# Patient Record
Sex: Male | Born: 1985 | Race: White | Hispanic: No | Marital: Married | Smoking: Never smoker
Health system: Southern US, Community
[De-identification: ages and names within clinical notes are randomized; demographics above are authoritative.]

## PROBLEM LIST (undated history)

## (undated) DIAGNOSIS — J45909 Unspecified asthma, uncomplicated: Secondary | ICD-10-CM

## (undated) HISTORY — DX: Unspecified asthma, uncomplicated: J45.909

## (undated) HISTORY — PX: NO PAST SURGERIES: SHX2092

---

## 2015-11-25 ENCOUNTER — Emergency Department
Admission: EM | Admit: 2015-11-25 | Discharge: 2015-11-25 | Disposition: A | Discharge status: Home / Self Care | Payer: No Typology Code available for payment source | Attending: Student | Admitting: Student

## 2015-11-25 ENCOUNTER — Encounter: Payer: Self-pay | Admitting: Emergency Medicine

## 2015-11-25 ENCOUNTER — Emergency Department: Payer: No Typology Code available for payment source

## 2015-11-25 DIAGNOSIS — Z23 Encounter for immunization: Secondary | ICD-10-CM | POA: Diagnosis not present

## 2015-11-25 DIAGNOSIS — S60852A Superficial foreign body of left wrist, initial encounter: Secondary | ICD-10-CM | POA: Diagnosis not present

## 2015-11-25 DIAGNOSIS — Y999 Unspecified external cause status: Secondary | ICD-10-CM | POA: Diagnosis not present

## 2015-11-25 DIAGNOSIS — W3400XA Accidental discharge from unspecified firearms or gun, initial encounter: Secondary | ICD-10-CM | POA: Diagnosis not present

## 2015-11-25 DIAGNOSIS — F17220 Nicotine dependence, chewing tobacco, uncomplicated: Secondary | ICD-10-CM | POA: Diagnosis not present

## 2015-11-25 DIAGNOSIS — Y939 Activity, unspecified: Secondary | ICD-10-CM | POA: Diagnosis not present

## 2015-11-25 DIAGNOSIS — S61502A Unspecified open wound of left wrist, initial encounter: Secondary | ICD-10-CM | POA: Diagnosis not present

## 2015-11-25 DIAGNOSIS — Y929 Unspecified place or not applicable: Secondary | ICD-10-CM | POA: Diagnosis not present

## 2015-11-25 MED ORDER — TETANUS-DIPHTH-ACELL PERTUSSIS 5-2.5-18.5 LF-MCG/0.5 IM SUSP
0.5000 mL | Freq: Once | INTRAMUSCULAR | Status: AC
  Administered 2015-11-25: 0.5 mL via INTRAMUSCULAR
  Filled 2015-11-25: qty 0.5
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
## 2015-11-25 NOTE — ED Notes (Signed)

## 2015-11-30 ENCOUNTER — Encounter: Payer: Self-pay | Admitting: General Surgery

## 2015-11-30 ENCOUNTER — Ambulatory Visit (INDEPENDENT_AMBULATORY_CARE_PROVIDER_SITE_OTHER): Payer: No Typology Code available for payment source | Admitting: General Surgery

## 2015-11-30 VITALS — BP 144/83 | HR 73 | Temp 98.7°F | Ht 74.0 in | Wt 213.0 lb

## 2015-11-30 DIAGNOSIS — M795 Residual foreign body in soft tissue: Secondary | ICD-10-CM | POA: Diagnosis not present
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
## 2015-11-30 NOTE — Patient Instructions (Addendum)
Risk of removal. It will only be removed if there is an infection. Please give us a call if you have any questions or concerns.

## 2016-09-24 ENCOUNTER — Emergency Department
Admission: EM | Admit: 2016-09-24 | Discharge: 2016-09-24 | Disposition: A | Discharge status: Home / Self Care | Payer: No Typology Code available for payment source | Attending: Emergency Medicine | Admitting: Emergency Medicine

## 2016-09-24 ENCOUNTER — Encounter: Payer: Self-pay | Admitting: Emergency Medicine

## 2016-09-24 ENCOUNTER — Emergency Department: Payer: No Typology Code available for payment source

## 2016-09-24 DIAGNOSIS — J45909 Unspecified asthma, uncomplicated: Secondary | ICD-10-CM | POA: Insufficient documentation

## 2016-09-24 DIAGNOSIS — F17210 Nicotine dependence, cigarettes, uncomplicated: Secondary | ICD-10-CM | POA: Diagnosis not present

## 2016-09-24 DIAGNOSIS — Z79899 Other long term (current) drug therapy: Secondary | ICD-10-CM | POA: Diagnosis not present

## 2016-09-24 DIAGNOSIS — F419 Anxiety disorder, unspecified: Secondary | ICD-10-CM | POA: Diagnosis not present

## 2016-09-24 DIAGNOSIS — R079 Chest pain, unspecified: Secondary | ICD-10-CM | POA: Diagnosis not present

## 2016-09-24 LAB — FIBRIN DERIVATIVES D-DIMER (ARMC ONLY): Fibrin derivatives D-dimer (ARMC): 97.17 (ref 0.00–499.00)

## 2016-09-24 LAB — BASIC METABOLIC PANEL
Anion gap: 8 (ref 5–15)
BUN: 14 mg/dL (ref 6–20)
CHLORIDE: 104 mmol/L (ref 101–111)
CO2: 28 mmol/L (ref 22–32)
Calcium: 9.4 mg/dL (ref 8.9–10.3)
Creatinine, Ser: 0.95 mg/dL (ref 0.61–1.24)
GFR calc non Af Amer: >60 mL/min (ref >60)
Glucose, Bld: 118 mg/dL — ABNORMAL HIGH (ref 65–99)
POTASSIUM: 3.5 mmol/L (ref 3.5–5.1)
SODIUM: 140 mmol/L (ref 135–145)

## 2016-09-24 LAB — CBC
HEMATOCRIT: 46.4 % (ref 40.0–52.0)
Hemoglobin: 15.9 g/dL (ref 13.0–18.0)
MCH: 30.6 pg (ref 26.0–34.0)
MCHC: 34.4 g/dL (ref 32.0–36.0)
MCV: 89 fL (ref 80.0–100.0)
Platelets: 251 10*3/uL (ref 150–440)
RBC: 5.21 MIL/uL (ref 4.40–5.90)
RDW: 13 % (ref 11.5–14.5)
WBC: 9.1 10*3/uL (ref 3.8–10.6)

## 2016-09-24 LAB — TROPONIN I: Troponin I: <0.03 ng/mL (ref <0.03)

## 2016-09-24 MED ORDER — LORAZEPAM 1 MG PO TABS
1.0000 mg | ORAL_TABLET | Freq: Once | ORAL | Status: AC
  Administered 2016-09-24: 1 mg via ORAL
  Filled 2016-09-24: qty 1

## 2016-09-24 MED ORDER — ASPIRIN 81 MG PO CHEW
324.0000 mg | CHEWABLE_TABLET | Freq: Once | ORAL | Status: AC
  Filled 2016-09-24: qty 4

## 2016-09-24 NOTE — ED Provider Notes (Signed)
[facility] Emergency Department Provider Note   First MD Initiated Contact with Patient 09/24/16 [phone]     (approximate)  I have reviewed the triage vital signs and the nursing notes.   HISTORY  Chief Complaint Chest Pain and Shortness of Breath    HPI Jesse Blanchard is a 31 y.o. male presents to the emergency department with intermittent left sided "chest tightness" that is nonradiating times one month. Patient states pain persistent tonight since onset. Patient also admits to dyspnea. Patient admits to a strong family history of CAD staining on his father at 6 vessel cornea artery bypass graft at the age of 67. Patient does admit to recent episodes of panic attacks and anxiety as well.  Past Medical History:  Diagnosis Date  . Asthma     Patient Active Problem List   Diagnosis Date Noted  . Foreign body (FB) in soft tissue 11/30/2015    Past Surgical History:  Procedure Laterality Date  . NO PAST SURGERIES      Prior to Admission medications   Medication Sig Start Date End Date Taking? Authorizing Provider  PROAIR HFA 108 [phone] Base) MCG/ACT inhaler Inhale 2 puffs into the lungs 1 day or 1 dose. 11/03/15   [provider]  SYMBICORT 160-4.5 MCG/ACT inhaler Inhale 2 puffs into the lungs 2 (two) times daily. 11/14/15   [provider]    Allergies No known drug allergies  Family History  Problem Relation Age of Onset  . Diabetes Mother   . Heart disease Father   . Diabetes Father   . Heart disease Paternal Grandfather     Social History Social History  Substance Use Topics  . Smoking status: Current Some Day Smoker    Start date: 11/29/2001  . Smokeless tobacco: Current User    Types: Chew  . Alcohol use Yes    Review of Systems Constitutional: No fever/chills Eyes: No visual changes. ENT: No sore throat. Cardiovascular: Positive chest pain. Respiratory: Denies shortness of breath. Gastrointestinal: No abdominal  pain.  No nausea, no vomiting.  No diarrhea.  No constipation. Genitourinary: Negative for dysuria. Musculoskeletal: Negative for neck pain.  Negative for back pain. Integumentary: Negative for rash. Neurological: Negative for headaches, focal weakness or numbness. Psychiatric:Positive for anxiety  ____________________________________________   PHYSICAL EXAM:  VITAL SIGNS: ED Triage Vitals [09/24/16 0543]  Enc Vitals Group     BP (!) 189/96     Pulse Rate 95     Resp 18     Temp 97.6 F (36.4 C)     Temp Source Oral     SpO2 100 %     Weight 93 kg (205 lb)     Height 1.88 m (6\' 2" )     Head Circumference      Peak Flow      Pain Score      Pain Loc      Pain Edu?      Excl. in GC?     Constitutional: Alert and oriented. Well appearing and in no acute distress. Eyes: Conjunctivae are normal. Head: Atraumatic. Mouth/Throat: Mucous membranes are moist.  Oropharynx non-erythematous. Neck: No stridor.   Cardiovascular: Normal rate, regular rhythm. Good peripheral circulation. Grossly normal heart sounds. Respiratory: Normal respiratory effort.  No retractions. Lungs CTAB. Gastrointestinal: Soft and nontender. No distention.  Musculoskeletal: No lower extremity tenderness nor edema. No gross deformities of extremities. Neurologic:  Normal speech and language. No gross focal neurologic deficits are appreciated.  Skin:  Skin is warm, dry and intact. No rash noted. Psychiatric: Very anxious affect. Speech and behavior are normal.  ____________________________________________   LABS (all labs ordered are listed, but only abnormal results are displayed)  Labs Reviewed  CBC  BASIC METABOLIC PANEL  TROPONIN I  FIBRIN DERIVATIVES D-DIMER ([facility] ONLY)   ____________________________________________  EKG  ED ECG REPORT I, [place] Chesnie Capell, the attending physician, personally viewed and interpreted this ECG.   Date: 09/24/2016  EKG Time: 5:47 AM  Rate: 98  Rhythm:  Normal sinus rhythm  Axis: Normal  Intervals: Normal  ST&T Change: None  ____________________________________________  RADIOLOGY I, [place] Janete Quilling, personally viewed and evaluated these images (plain radiographs) as part of my medical decision making, as well as reviewing the written report by the radiologist.  Dg Chest Port 1 View  Result Date: 09/24/2016 CLINICAL DATA:  Chest pain and shortness of breath. Symptoms for 1 month, progressive. EXAM: PORTABLE CHEST 1 VIEW COMPARISON:  None. FINDINGS: The cardiomediastinal contours are normal. The lungs are clear. Pulmonary vasculature is normal. No consolidation, pleural effusion, or pneumothorax. No acute osseous abnormalities are seen. IMPRESSION: No acute pulmonary process. Electronically Signed   By: Rubye OaksMelanie  Ehinger M.D.   On: 09/24/2016 06:23     Procedures   ____________________________________________   INITIAL IMPRESSION / ASSESSMENT AND PLAN / ED COURSE  Pertinent labs & imaging results that were available during my care of the patient were reviewed by me and considered in my medical decision making (see chart for details).   31 year old male presenting to the emergency department with intermittent left-sided chest tightness and anxiety. Patient states he's had anxiety attacks so profound that he had to pull over while driving. Patient very anxious during evaluation. EKG revealed no evidence of ischemia or infarction initial troponin negative however will obtain second troponin. D-dimer obtained which was negative and a low risk patient for pulmonary emboli. Anticipate discharge home if second troponin is indeed negative.     ____________________________________________  FINAL CLINICAL IMPRESSION(S) / ED DIAGNOSES  Final diagnoses:  Chest pain, unspecified type  Anxiety   MEDICATIONS GIVEN DURING THIS VISIT:  Medications  LORazepam (ATIVAN) tablet 1 mg (1 mg Oral Given 09/24/16 0616)  aspirin chewable tablet 324 mg  [facility]     NEW OUTPATIENT MEDICATIONS STARTED DURING THIS VISIT:  New Prescriptions   No medications on file    Modified Medications   No medications on file    Discontinued Medications   No medications on file     Note:  This document was prepared using Dragon voice recognition software and may include unintentional dictation errors.    Darci CurrentBrown, [place], MD 09/24/16 [phone]

## 2016-09-24 NOTE — ED Triage Notes (Addendum)
Pt presents to ED with worsening pressure and tightness in the left side of his chest and sob. Pt states he first noticed his symptoms over a month ago. Pt concerned due to significant family cardiac hx. Pt currently has no increased work of breathing noted. Appears anxious.

## 2016-09-26 ENCOUNTER — Ambulatory Visit (INDEPENDENT_AMBULATORY_CARE_PROVIDER_SITE_OTHER): Payer: No Typology Code available for payment source | Admitting: Family Medicine

## 2016-09-26 ENCOUNTER — Encounter: Payer: Self-pay | Admitting: Family Medicine

## 2016-09-26 VITALS — BP 110/70 | HR 87 | Temp 98.4°F | Resp 16 | Ht 74.0 in | Wt 210.0 lb

## 2016-09-26 DIAGNOSIS — F410 Panic disorder [episodic paroxysmal anxiety] without agoraphobia: Secondary | ICD-10-CM

## 2016-09-26 DIAGNOSIS — R0789 Other chest pain: Secondary | ICD-10-CM | POA: Diagnosis not present

## 2016-09-26 DIAGNOSIS — F411 Generalized anxiety disorder: Secondary | ICD-10-CM | POA: Diagnosis not present

## 2016-09-26 DIAGNOSIS — J4540 Moderate persistent asthma, uncomplicated: Secondary | ICD-10-CM | POA: Insufficient documentation

## 2016-09-26 DIAGNOSIS — Z7689 Persons encountering health services in other specified circumstances: Secondary | ICD-10-CM | POA: Diagnosis not present

## 2016-09-26 MED ORDER — LORAZEPAM 0.5 MG PO TABS: 0.5000 mg | ORAL_TABLET | Freq: Every day | ORAL | 0 refills | Status: DC | PRN

## 2016-09-26 MED ORDER — ESCITALOPRAM OXALATE 20 MG PO TABS: 10.0000 mg | ORAL_TABLET | Freq: Every day | ORAL | 2 refills | Status: DC

## 2016-09-26 MED ORDER — MONTELUKAST SODIUM 10 MG PO TABS: 10.0000 mg | ORAL_TABLET | Freq: Every day | ORAL | 2 refills | Status: DC

## 2016-09-26 NOTE — Patient Instructions (Addendum)
Thank you for coming to the clinic today.  1.  As discussed, it sounds like your symptoms are primarily related to anxiety / adjustment disorder. This is a very common problem and be related to several factors, including life stressors. Start treatment with Escitalopram (Lexapro) 10mg  (CUT 20mg  tabs IN HALF - once daily same time with food. If after 4 weeks you feel better but not significantly resolved, may increase to 20mg  and take whole pill  As discussed most anxiety medications are also used for mood disorders such as depression, because they work on similar chemicals in your brain. It may take up to 3-4 weeks for the medicine to take full effect and for you to notice a difference, sometimes you may notice it working sooner, otherwise we may need to adjust the dose.  Also prescribed Lorazepam 0.5mg  take this as needed ONLY for more severe panic attacks, ideally can avoid taking this all together. I do not plan to refill this in future, this is only to bridge therapy to the Escitalopram.  For most patients with anxiety or mood concerns, we generally recommend referral to establish with a therapist or counselor as well. This has been shown to improve the effectiveness of the medications, and in the future we may be able to taper off medications. We will discuss at future appointments.  Look up other deep breathing techniques for acute panic as needed.  4-7-8 breathing technique at bedtime OR at time of panic: breathe in to count of 4, hold breath for count of 7, exhale for count of 8; do 3-5 times for letting go of overactive thoughts  Think of your senses one at a time: 1) touch - feel that you are seated 2) vision - close your eyes and focus on seeing nothing 3) smell - focus on any scent nearby 4) hearing - focus only on listening  -------------------------------------------------------------------------------------------------------  For asthma, go ahead and try the Singulair  (Montelukast) 10mg , sent rx. Take this at bedtime only once nightly - wait for 1-2 weeks until you are more stable on your anxiety medicine first ------------------------------------------------------------------------------------------------------ For the future we can refer you to Cardiologist for stress test and other evaluation at that time.  If you have any significant chest pain or pressure that does not go away within 30 minutes, is accompanied by nausea, sweating, shortness of breath, or made worse by activity, this may be evidence of a heart attack, especially if symptoms worsening instead of improving, please call 911 or go directly to the emergency room immediately for evaluation.  Please schedule a Follow-up Appointment to: Return in about 6 weeks (around 11/07/2016) for Anxiety GAD7, Chest Pressure,  Asthma.  If you have any other questions or concerns, please feel free to call the clinic or send a message through MyChart. You may also schedule an earlier appointment if necessary.  Saralyn PilarAlexander Karamalegos, DO [facility]

## 2016-09-26 NOTE — Assessment & Plan Note (Signed)
Consistent with moderate persistent asthma given chronicity of symptoms and reported albuterol use. Now improved on maintenance, symbicort - Uncertain exact triggers - No recent ED or hospitalizations, or night-time awakening  Plan: 1. Continue current therapy - Symbicort maintenance, Albuterol PRN rescue - advised to limit use avoid daily use 2. Start new therapy with Singulair nightly 3. Return criteria given to follow-up vs when to go to ED

## 2016-09-26 NOTE — Progress Notes (Signed)
Subjective:    Patient ID: Jesse PriestJonathan Clairmont, male    DOB: [birth year], 31 y.o.   MRN: [id number]  Jesse Blanchard is a 31 y.o. male presenting on 09/26/2016 for Establish Care (chest pain SOB has Hx of asthma went to ER onset 2 days ago and wanted pt to f/u with PCP)  No PCP since childhood. He goes to [facility] for asthma treatments in past. Here today after recent ED visit on 09/24/16.  HPI   FOLLOW-UP ED Chest Pressure / Anxiety with Panic Attacks - Recently [facility] ED 09/24/16 for recent worsening episode chest pressure associated with dyspnea, see ED note, briefly with normal chemistry, negative troponin, CXR and EKG, resolved with Lorazepam 1mg  oral x 1 dose. Discharged, determined to be due to panic attack. - Today here to establish care now for PCP, primarily concerned about recurrent chest pressure episodes and anxiety. He describes episodes as Left sided chest pressure and tightness without pain, can last for hours at a time, occasionally can feel hot flashes with it and rarely with associated dyspnea. Almost always triggered by emotional stress, and never seemed to be provoked by physical strain and exertion, often improves at work. - Regarding anxiety, describes chronic problem with some form of anxiety for several years. He is unsure the onset of this problem, but he admits he may have had a similar problem about 3 years ago while driving out of state to [place], maybe under stress at that time considered anxiety, self limited. - He has not sought out medical care for this, but recently worse now over past 2 months, with inc stress at work. Describes more constant worry and can't "shut mind off". He is traveling tonight to [place] to pick up son. - No prior treatments in past for anxiety, no psychiatry or therapy - Drinks a lot of sweet tea and caffeine, no other drinks. Drinks water during summer - Limited alcohol. Uses chewing tobacco. No other drugs or substances - His wife is on  Lexapro - Denies depression, sadness, mania, suicidal or homicidal ideation  Elevated BP without diagnosis of HTN: Reports no known diagnosis of HTN, prior on screening and home BP checks recently has Pre-HTN elevated to 130s occasional 140, and DBP normal range. Current Meds - never been on anti-HTN meds before Lifestyle - no regular exercise Denies CP, dyspnea, HA, edema, dizziness / lightheadedness  Asthma, moderate persistent - Reports chronic history of asthma. Had been followed by [facility] or Urgent Care. Has been on Albuterol previously used frequently, unclear exact triggers. Has been on Symbicort for past >1 year with significant improvement, now only uses Albuterol 1x daily or not each day. - Denies dyspnea, wheezing, night-time awakening, recent flare  Additional Social history - Formerly employed as Merchant navy officerpolice officer and firefighter  GAD 7 : Generalized Anxiety Score 09/26/2016  Nervous, Anxious, on Edge 3  Control/stop worrying 2  Worry too much - different things 3  Trouble relaxing 3  Restless 2  Easily annoyed or irritable 0  Afraid - awful might happen 2  Total GAD 7 Score 15  Anxiety Difficulty Not difficult at all    [facility] 09/26/2016  Decreased Interest 0  Down, Depressed, Hopeless 0  PHQ - 2 Score 0    Past Medical History:  Diagnosis Date  . Asthma    Past Surgical History:  Procedure Laterality Date  . NO PAST SURGERIES     Social History   Social History  .  Marital status: Married    Spouse name: N/A  . Number of children: N/A  . Years of education: High School   Occupational History  . Production designer, theatre/television/film, [employer]     [employer] ([place]   Social History Main Topics  . Smoking status: Current Some Day Smoker    Start date: 11/29/2001  . Smokeless tobacco: Current User    Types: Chew     Comment: 1 can chewing tobacco daily, 15 years  . Alcohol use Yes     Comment: occasionally weekends  . Drug use: No  . Sexual  activity: Not on file   Other Topics Concern  . Not on file   Social History Narrative  . No narrative on file   Family History  Problem Relation Age of Onset  . Diabetes Mother   . Heart disease Father   . Diabetes Father   . Heart attack Father 26       S/p CABG  . Heart disease Paternal Grandfather   . Heart attack Paternal Grandfather 15       repeat age 20 fatal  . Diabetes Sister   . Hypertension Sister   . Heart attack Maternal Grandmother 60  . Cancer Maternal Grandfather        Undetermined, possible GI malignancy  . Prostate cancer Neg Hx    Current Outpatient Prescriptions on File Prior to Visit  Medication Sig  . PROAIR HFA 108 (90 Base) MCG/ACT inhaler Inhale 2 puffs into the lungs 1 day or 1 dose.  . SYMBICORT 160-4.5 MCG/ACT inhaler Inhale 2 puffs into the lungs 2 (two) times daily.   No current facility-administered medications on file prior to visit.     Review of Systems  Constitutional: Negative for activity change, appetite change, chills, diaphoresis, fatigue and fever.  HENT: Negative for congestion, hearing loss and sinus pressure.   Eyes: Negative for visual disturbance.  Respiratory: Positive for wheezing (Not actively). Negative for apnea, cough, chest tightness and shortness of breath (Improved).   Cardiovascular: Negative for chest pain, palpitations and leg swelling.  Gastrointestinal: Negative for abdominal pain, anal bleeding, blood in stool, constipation, diarrhea, nausea and vomiting.  Endocrine: Negative for polyuria.  Genitourinary: Negative for decreased urine volume, difficulty urinating, dysuria, frequency and hematuria.  Musculoskeletal: Negative for arthralgias, back pain and neck pain.  Skin: Negative for rash.  Allergic/Immunologic: Positive for environmental allergies.  Neurological: Negative for dizziness, weakness, light-headedness, numbness and headaches.  Hematological: Negative for adenopathy.  Psychiatric/Behavioral:  Negative for agitation, behavioral problems, decreased concentration, dysphoric mood, hallucinations, self-injury, sleep disturbance and suicidal ideas. The patient is nervous/anxious. The patient is not hyperactive.    Per HPI unless specifically indicated above     Objective:    BP 110/70 (BP Location: Left Arm, Cuff Size: Normal)   Pulse 87   Temp 98.4 F (36.9 C) (Oral)   Resp 16   Ht 6\' 2"  (1.88 m)   Wt 210 lb (95.3 kg)   SpO2 98%   BMI 26.96 kg/m   Wt Readings from Last 3 Encounters:  09/26/16 210 lb (95.3 kg)  09/24/16 205 lb (93 kg)  11/30/15 213 lb (96.6 kg)    Physical Exam  Constitutional: He is oriented to person, place, and time. He appears well-developed and well-nourished. No distress.  Well-appearing, comfortable, cooperative  HENT:  Head: Normocephalic and atraumatic.  Mouth/Throat: Oropharynx is clear and moist.  Eyes: Conjunctivae are normal. Right eye exhibits no discharge. Left eye exhibits  no discharge.  Neck: Normal range of motion. Neck supple.  Cardiovascular: Normal rate, regular rhythm, normal heart sounds and intact distal pulses.   No murmur heard. Pulmonary/Chest: Breath sounds normal. No respiratory distress. He has no wheezes. He has no rales. He exhibits no tenderness (Non reproducible on exam).  Musculoskeletal: Normal range of motion. He exhibits no edema or tenderness.  Neurological: He is alert and oriented to person, place, and time.  Skin: Skin is warm and dry. No rash noted. He is not diaphoretic. No erythema.  Psychiatric: He has a normal mood and affect. His behavior is normal.  Well groomed, good eye contact, normal speech and thoughts. Mildly anxious initially, then seemed to calm down and very appropriate during exam. Good insight into health.  Nursing note and vitals reviewed.  Results for orders placed or performed during the hospital encounter of 09/24/16  Basic metabolic panel  Result Value Ref Range   Sodium 140 135 - 145  mmol/L   Potassium 3.5 3.5 - 5.1 mmol/L   Chloride 104 101 - 111 mmol/L   CO2 28 22 - 32 mmol/L   Glucose, Bld 118 (H) 65 - 99 mg/dL   BUN 14 6 - 20 mg/dL   Creatinine, Ser 6.96 0.61 - 1.24 mg/dL   Calcium 9.4 8.9 - 29.5 mg/dL   GFR calc non Af Amer >60 >60 mL/min   GFR calc Af Amer >60 >60 mL/min   Anion gap 8 5 - 15  CBC  Result Value Ref Range   WBC 9.1 3.8 - 10.6 K/uL   RBC 5.21 4.40 - 5.90 MIL/uL   Hemoglobin 15.9 13.0 - 18.0 g/dL   HCT 28.4 13.2 - 44.0 %   MCV 89.0 80.0 - 100.0 fL   MCH 30.6 26.0 - 34.0 pg   MCHC 34.4 32.0 - 36.0 g/dL   RDW 10.2 72.5 - 36.6 %   Platelets 251 150 - 440 K/uL  Troponin I  Result Value Ref Range   Troponin I <0.03 <0.03 ng/mL  Fibrin derivatives D-Dimer  Result Value Ref Range   Fibrin derivatives D-dimer ([facility]) 97.17 0.00 - 499.00  Troponin I  Result Value Ref Range   Troponin I <0.03 <0.03 ng/mL      Assessment & Plan:   Problem List Items Addressed This Visit    Generalized anxiety disorder with panic attacks - Primary    Suspected new dx GAD now with gradual worsening causing difficulty functioning with panic attacks >2 months now (gradual worse problem over few years) -GAD7: 15. No evidence of co-morbid depression - No prior medications - Lorazepam in ED resolved panic attack - No prior dx / Psych / counseling - Checked [place] CSRS for past 1 year  Plan: 1. Discussion on new diagnosis anxiety, management, complications 2. Start Escitalopram 10mg  daily (Half of 20mg  tab) AM with food, counseling on potential side effects risks, reviewed possible GI intolerance, insomnia (although likely to improve this given anxiety likely source of insomnia), sexual dysfunction, reviewed black box warning inc suicidal (no prior history, unlikely concern) - anticipate 4-6 weeks for notable effect, may need titrate dose to 20 in future if needed 3. Advised recommend therapy / counseling in future - declined currently 4. Additionally given severity of  prior panic attack - discussion on BDZ therapy as PRN and caution on not ideal for long-term management, reviewed controlled substance, risk and withdrawal. Patient not requesting this med, but concern he will be unstable for 4-6 weeks while SSRI  works, and offered rx one time Lorazepam 0.5mg  once daily PRN anxiety/panic, only use if severe attack #10, 0 refills, caution sedation / driving 5. Follow-up 4-6 weeks anxiety, med adjust, GAD7/PHQ9      Relevant Medications   escitalopram (LEXAPRO) 20 MG tablet   LORazepam (ATIVAN) 0.5 MG tablet   Chest tightness or pressure    Initially with active chest pressure during office visit, and admitted anxiety about doctors visit, after history patient reports 100% resolved chest pressure with feeling more calm with plan. - Denies active chest pain today - S/p ED visit 09/24/16 with negative EKG, troponin, CXR resolved with Lorapzeam - Still concern with significant family history of MI multiple family members ages 36-60s  Plan: 1. Reassurance clinically seems less likely cardiac etiology, low risk factors except family history. 2. Treat underlying anxiety to see if episodes of panic resolve or improve 3. Given family history still agree that likely would benefit from discussing his cardiac risk with Cardiology in future, may consider baseline stress testing 4. Return precautions given to ED if severe acute panic vs chest pain/pressure not improving      Asthma, moderate persistent    Consistent with moderate persistent asthma given chronicity of symptoms and reported albuterol use. Now improved on maintenance, symbicort - Uncertain exact triggers - No recent ED or hospitalizations, or night-time awakening  Plan: 1. Continue current therapy - Symbicort maintenance, Albuterol PRN rescue - advised to limit use avoid daily use 2. Start new therapy with Singulair nightly 3. Return criteria given to follow-up vs when to go to ED      Relevant  Medications   montelukast (SINGULAIR) 10 MG tablet      Meds ordered this encounter  Medications  . Multiple Vitamin (MULTIVITAMIN) tablet    Sig: Take 1 tablet by mouth daily.  Marland Kitchen aspirin EC 81 MG tablet    Sig: Take 81 mg by mouth daily.  Marland Kitchen escitalopram (LEXAPRO) 20 MG tablet    Sig: Take 0.5 tablets (10 mg total) by mouth daily. May increase dose to 20mg  whole pill after 3-4 weeks    Dispense:  30 tablet    Refill:  2  . LORazepam (ATIVAN) 0.5 MG tablet    Sig: Take 1 tablet (0.5 mg total) by mouth daily as needed for anxiety.    Dispense:  10 tablet    Refill:  0  . montelukast (SINGULAIR) 10 MG tablet    Sig: Take 1 tablet (10 mg total) by mouth at bedtime.    Dispense:  30 tablet    Refill:  2    Follow up plan: Return in about 6 weeks (around 11/07/2016) for Anxiety GAD7, Chest Pressure,  Asthma.  Saralyn Pilar, DO [facility] 09/26/2016, 10:28 PM

## 2016-09-26 NOTE — Assessment & Plan Note (Addendum)
Suspected new dx GAD now with gradual worsening causing difficulty functioning with panic attacks >2 months now (gradual worse problem over few years) -GAD7: 15. No evidence of co-morbid depression - No prior medications - Lorazepam in ED resolved panic attack - No prior dx / Psych / counseling - Checked [place] CSRS for past 1 year  Plan: 1. Discussion on new diagnosis anxiety, management, complications 2. Start Escitalopram 10mg  daily (Half of 20mg  tab) AM with food, counseling on potential side effects risks, reviewed possible GI intolerance, insomnia (although likely to improve this given anxiety likely source of insomnia), sexual dysfunction, reviewed black box warning inc suicidal (no prior history, unlikely concern) - anticipate 4-6 weeks for notable effect, may need titrate dose to 20 in future if needed 3. Advised recommend therapy / counseling in future - declined currently 4. Additionally given severity of prior panic attack - discussion on BDZ therapy as PRN and caution on not ideal for long-term management, reviewed controlled substance, risk and withdrawal. Patient not requesting this med, but concern he will be unstable for 4-6 weeks while SSRI works, and offered rx one time Lorazepam 0.5mg  once daily PRN anxiety/panic, only use if severe attack #10, 0 refills, caution sedation / driving 5. Follow-up 4-6 weeks anxiety, med adjust, GAD7/PHQ9

## 2016-09-26 NOTE — Assessment & Plan Note (Signed)
Initially with active chest pressure during office visit, and admitted anxiety about doctors visit, after history patient reports 100% resolved chest pressure with feeling more calm with plan. - Denies active chest pain today - S/p ED visit 09/24/16 with negative EKG, troponin, CXR resolved with Lorapzeam - Still concern with significant family history of MI multiple family members ages 1950-60s  Plan: 1. Reassurance clinically seems less likely cardiac etiology, low risk factors except family history. 2. Treat underlying anxiety to see if episodes of panic resolve or improve 3. Given family history still agree that likely would benefit from discussing his cardiac risk with Cardiology in future, may consider baseline stress testing 4. Return precautions given to ED if severe acute panic vs chest pain/pressure not improving

## 2017-01-06 ENCOUNTER — Other Ambulatory Visit: Payer: Self-pay | Admitting: Family Medicine

## 2017-01-06 DIAGNOSIS — J4540 Moderate persistent asthma, uncomplicated: Secondary | ICD-10-CM

## 2017-01-06 DIAGNOSIS — F411 Generalized anxiety disorder: Principal | ICD-10-CM

## 2017-01-06 DIAGNOSIS — F410 Panic disorder [episodic paroxysmal anxiety] without agoraphobia: Secondary | ICD-10-CM

## 2017-10-09 ENCOUNTER — Other Ambulatory Visit: Payer: Self-pay

## 2017-10-12 ENCOUNTER — Encounter: Payer: Self-pay | Admitting: Family Medicine

## 2017-10-12 ENCOUNTER — Ambulatory Visit (INDEPENDENT_AMBULATORY_CARE_PROVIDER_SITE_OTHER): Payer: No Typology Code available for payment source | Admitting: Family Medicine

## 2017-10-12 VITALS — BP 152/89 | HR 75 | Resp 16 | Ht 74.0 in | Wt 213.0 lb

## 2017-10-12 DIAGNOSIS — F411 Generalized anxiety disorder: Secondary | ICD-10-CM | POA: Diagnosis not present

## 2017-10-12 DIAGNOSIS — F410 Panic disorder [episodic paroxysmal anxiety] without agoraphobia: Secondary | ICD-10-CM | POA: Diagnosis not present

## 2017-10-12 DIAGNOSIS — J4540 Moderate persistent asthma, uncomplicated: Secondary | ICD-10-CM | POA: Diagnosis not present

## 2017-10-12 MED ORDER — SYMBICORT 160-4.5 MCG/ACT IN AERO: 2.0000 | INHALATION_SPRAY | Freq: Two times a day (BID) | RESPIRATORY_TRACT | 11 refills | Status: DC

## 2017-10-12 MED ORDER — PROAIR HFA 108 (90 BASE) MCG/ACT IN AERS: 2.0000 | INHALATION_SPRAY | RESPIRATORY_TRACT | 5 refills | Status: DC

## 2017-10-12 MED ORDER — MONTELUKAST SODIUM 10 MG PO TABS: 10.0000 mg | ORAL_TABLET | Freq: Every day | ORAL | 11 refills | Status: DC

## 2017-10-12 NOTE — Progress Notes (Signed)
Subjective:    Patient ID: Jesse Blanchard, male    DOB: 07-07-1985, 32 y.o.   MRN: [id number]  Jesse Blanchard is a 32 y.o. male presenting on 10/12/2017 for Anxiety (follow up/ med refill ) and Asthma (needs refill on inhalers )   HPI   Follow-up GAD with history of panic Last visit with me 09/2016, started on new Escitalopram 10-20mg  and goal to limit BDZ at that time given only #10 pills of Lorazepam given prior ED panic attack - Interval update - he has done very well with anxiety, less panic, seems breathing better and anxiety is better - He no longer takes Escitalopram regularly only takes in advance of known stressors, usually will take for 1-2 week before needed then taper down and stop. - Rarely taking Lorazepam, still has about 7-8 of the 10 pills left, does not need new rx - Denies recent depression mood or panic  Asthma, moderate persistent Last visit 09/2016 with me, he was started on Singulair (montelukast) 10mg  nightly in addition to his current Symbicort maintenance and Albuterol PRN - He has done very well on Singulair change since that visit, seems to significantly improve his daily asthma control and also reduce his anxiety overall now with better breathing. - No recent flare up, hospitalization or night-time awakening - Due for refills today - Denies wheezing dyspnea or cough  Additional complaints - Not focus of visit today - he reports problem with Left knee pain and "popping" feels like it has some instability at times "gives out" and his Right Foot has heel pain.  Health Maintenance: Reported UTD routine HIV screen last 11/2016 at health department.  Depression screen [facility] 2/9 10/12/2017 09/26/2016  Decreased Interest 0 0  Down, Depressed, Hopeless 0 0  PHQ - 2 Score 0 0   GAD 7 : Generalized Anxiety Score 10/13/2017 09/26/2016  Nervous, Anxious, on Edge 1 3  Control/stop worrying 1 2  Worry too much - different things 1 3  Trouble relaxing 2 3  Restless 0 2    Easily annoyed or irritable 0 0  Afraid - awful might happen 1 2  Total GAD 7 Score 6 15  Anxiety Difficulty Not difficult at all Not difficult at all     Social History   Tobacco Use  . Smoking status: Current Some Day Smoker    Start date: 11/29/2001  . Smokeless tobacco: Current User    Types: Chew  . Tobacco comment: 1 can chewing tobacco daily, 15 years  Substance Use Topics  . Alcohol use: Yes    Comment: occasionally weekends  . Drug use: No    Review of Systems Per HPI unless specifically indicated above     Objective:    BP (!) 152/89   Pulse 75   Resp 16   Ht 6\' 2"  (1.88 m)   Wt 213 lb (96.6 kg)   SpO2 99%   BMI 27.35 kg/m   Wt Readings from Last 3 Encounters:  10/12/17 213 lb (96.6 kg)  09/26/16 210 lb (95.3 kg)  09/24/16 205 lb (93 kg)    Physical Exam  Constitutional: He is oriented to person, place, and time. He appears well-developed and well-nourished. No distress.  Well-appearing, comfortable, cooperative  HENT:  Head: Normocephalic and atraumatic.  Mouth/Throat: Oropharynx is clear and moist.  Eyes: Conjunctivae are normal. Right eye exhibits no discharge. Left eye exhibits no discharge.  Cardiovascular: Normal rate.  Pulmonary/Chest: Effort normal.  Musculoskeletal: He exhibits no edema.  Neurological: He is alert and oriented to person, place, and time.  Skin: Skin is warm and dry. No rash noted. He is not diaphoretic. No erythema.  Psychiatric: He has a normal mood and affect. His behavior is normal.  Well groomed, good eye contact, normal speech and thoughts. Not anxious appearing  Nursing note and vitals reviewed.  Results for orders placed or performed during the hospital encounter of 09/24/16  Basic metabolic panel  Result Value Ref Range   Sodium 140 135 - 145 mmol/L   Potassium 3.5 3.5 - 5.1 mmol/L   Chloride 104 101 - 111 mmol/L   CO2 28 22 - 32 mmol/L   Glucose, Bld 118 (H) 65 - 99 mg/dL   BUN 14 6 - 20 mg/dL   Creatinine,  Ser 9.560.95 0.61 - 1.24 mg/dL   Calcium 9.4 8.9 - 21.310.3 mg/dL   GFR calc non Af Amer >60 >60 mL/min   GFR calc Af Amer >60 >60 mL/min   Anion gap 8 5 - 15  CBC  Result Value Ref Range   WBC 9.1 3.8 - 10.6 K/uL   RBC 5.21 4.40 - 5.90 MIL/uL   Hemoglobin 15.9 13.0 - 18.0 g/dL   HCT 08.646.4 57.840.0 - 46.952.0 %   MCV 89.0 80.0 - 100.0 fL   MCH 30.6 26.0 - 34.0 pg   MCHC 34.4 32.0 - 36.0 g/dL   RDW 62.913.0 52.811.5 - 41.314.5 %   Platelets 251 150 - 440 K/uL  Troponin I  Result Value Ref Range   Troponin I <0.03 <0.03 ng/mL  Fibrin derivatives D-Dimer  Result Value Ref Range   Fibrin derivatives D-dimer ([facility]) 97.17 0.00 - 499.00  Troponin I  Result Value Ref Range   Troponin I <0.03 <0.03 ng/mL      Assessment & Plan:   Problem List Items Addressed This Visit    Asthma, moderate persistent - Primary    Stable, well controlled on singulair / symbicort - consistent w/ moderate persistent asthma - Uncertain exact triggers - No recent ED or hospitalizations, or night-time awakening  Plan: 1. Continue current therapy - Singulair 10mg  nightly, Symbicort maintenance, Albuterol PRN rescue - advised to limit use avoid daily use - Refilled meds 2. Return criteria given to follow-up vs when to go to ED      Relevant Medications   PROAIR HFA 108 (90 Base) MCG/ACT inhaler   SYMBICORT 160-4.5 MCG/ACT inhaler   montelukast (SINGULAIR) 10 MG tablet   Generalized anxiety disorder with panic attacks    Significantly improved overall GAD, now on intermittent SSRI escitalopram, rarely use BDZ Lorazepam History of rare panic attack -GAD7: 15 > improved to 6. No evidence of co-morbid depression - No prior dx / Psych / counseling  Plan: 1. Remain off meds - may take SSRI Escitalopram 10 or 20mg  (half or whole tab) daily for 1-2 weeks if need in future, taper off as indicated - May use Lorapzeam existing rx remaining few pills PRN only - can refill as temporary as long as very limited #10 pills or less, goal to  remain off in future - Future may benefit from therapist if worse anxiety Follow-up for annual phys and labs         Meds ordered this encounter  Medications  . PROAIR HFA 108 (90 Base) MCG/ACT inhaler    Sig: Inhale 2 puffs into the lungs 1 day or 1 dose.    Dispense:  1 Inhaler    Refill:  5  .  SYMBICORT 160-4.5 MCG/ACT inhaler    Sig: Inhale 2 puffs into the lungs 2 (two) times daily.    Dispense:  1 Inhaler    Refill:  11  . montelukast (SINGULAIR) 10 MG tablet    Sig: Take 1 tablet (10 mg total) by mouth at bedtime.    Dispense:  30 tablet    Refill:  11      Follow up plan: Return in about 1 month (around 11/09/2017) for Annual Physical.  Future labs ordered for 11/13/17  Saralyn Pilar, DO [facility] 10/13/2017, 1:36 AM

## 2017-10-12 NOTE — Patient Instructions (Addendum)
Thank you for coming to the office today.  Refilled all rx, except lexapro for now, and may finish current Lorazepam as needed - contact office if need adjustment or refills   DUE for FASTING BLOOD WORK (no food or drink after midnight before the lab appointment, only water or coffee without cream/sugar on the morning of)  SCHEDULE "Lab Only" visit in the morning at the clinic for lab draw in 4 weeks  - Make sure Lab Only appointment is at about 1 week before your next appointment, so that results will be available  For Lab Results, once available within 2-3 days of blood draw, you can can log in to MyChart online to view your results and a brief explanation. Also, we can discuss results at next follow-up visit.  Please schedule a Follow-up Appointment to: Return in about 1 month (around 11/09/2017) for Annual Physical.  If you have any other questions or concerns, please feel free to call the office or send a message through MyChart. You may also schedule an earlier appointment if necessary.  Additionally, you may be receiving a survey about your experience at our office within a few days to 1 week by e-mail or mail. We value your feedback.  Saralyn PilarAlexander Taequan Stockhausen, DO [facility]

## 2017-10-13 ENCOUNTER — Encounter: Payer: Self-pay | Admitting: Family Medicine

## 2017-10-13 ENCOUNTER — Other Ambulatory Visit: Payer: Self-pay | Admitting: Family Medicine

## 2017-10-13 DIAGNOSIS — F410 Panic disorder [episodic paroxysmal anxiety] without agoraphobia: Secondary | ICD-10-CM

## 2017-10-13 DIAGNOSIS — Z0000 Encounter for general adult medical examination without abnormal findings: Secondary | ICD-10-CM

## 2017-10-13 DIAGNOSIS — R7309 Other abnormal glucose: Secondary | ICD-10-CM

## 2017-10-13 DIAGNOSIS — F411 Generalized anxiety disorder: Secondary | ICD-10-CM

## 2017-10-13 DIAGNOSIS — J4540 Moderate persistent asthma, uncomplicated: Secondary | ICD-10-CM

## 2017-10-13 NOTE — Assessment & Plan Note (Signed)
Significantly improved overall GAD, now on intermittent SSRI escitalopram, rarely use BDZ Lorazepam History of rare panic attack -GAD7: 15 > improved to 6. No evidence of co-morbid depression - No prior dx / Psych / counseling  Plan: 1. Remain off meds - may take SSRI Escitalopram 10 or 20mg  (half or whole tab) daily for 1-2 weeks if need in future, taper off as indicated - May use Lorapzeam existing rx remaining few pills PRN only - can refill as temporary as long as very limited #10 pills or less, goal to remain off in future - Future may benefit from therapist if worse anxiety Follow-up for annual phys and labs

## 2017-10-13 NOTE — Assessment & Plan Note (Signed)
Stable, well controlled on singulair / symbicort - consistent w/ moderate persistent asthma - Uncertain exact triggers - No recent ED or hospitalizations, or night-time awakening  Plan: 1. Continue current therapy - Singulair 10mg  nightly, Symbicort maintenance, Albuterol PRN rescue - advised to limit use avoid daily use - Refilled meds 2. Return criteria given to follow-up vs when to go to ED

## 2017-11-13 ENCOUNTER — Other Ambulatory Visit: Payer: No Typology Code available for payment source

## 2017-11-13 DIAGNOSIS — F411 Generalized anxiety disorder: Secondary | ICD-10-CM

## 2017-11-13 DIAGNOSIS — F410 Panic disorder [episodic paroxysmal anxiety] without agoraphobia: Secondary | ICD-10-CM

## 2017-11-13 DIAGNOSIS — R7309 Other abnormal glucose: Secondary | ICD-10-CM

## 2017-11-13 DIAGNOSIS — Z0000 Encounter for general adult medical examination without abnormal findings: Secondary | ICD-10-CM

## 2017-11-14 LAB — CBC WITH DIFFERENTIAL/PLATELET
BASOS PCT: 1.4 %
Basophils Absolute: 90 cells/uL (ref 0–200)
EOS PCT: 9.2 %
Eosinophils Absolute: 589 cells/uL — ABNORMAL HIGH (ref 15–500)
HCT: 46.2 % (ref 38.5–50.0)
Hemoglobin: 15.7 g/dL (ref 13.2–17.1)
Lymphs Abs: 2112 cells/uL (ref 850–3900)
MCH: 31.1 pg (ref 27.0–33.0)
MCHC: 34 g/dL (ref 32.0–36.0)
MCV: 91.5 fL (ref 80.0–100.0)
MONOS PCT: 7.2 %
MPV: 10.5 fL (ref 7.5–12.5)
Neutro Abs: 3149 cells/uL (ref 1500–7800)
Neutrophils Relative %: 49.2 %
PLATELETS: 235 10*3/uL (ref 140–400)
RBC: 5.05 10*6/uL (ref 4.20–5.80)
RDW: 12.6 % (ref 11.0–15.0)
TOTAL LYMPHOCYTE: 33 %
WBC: 6.4 10*3/uL (ref 3.8–10.8)
WBCMIX: 461 {cells}/uL (ref 200–950)

## 2017-11-14 LAB — COMPLETE METABOLIC PANEL WITH GFR
AG RATIO: 1.8 (calc) (ref 1.0–2.5)
ALT: 46 U/L (ref 9–46)
AST: 23 U/L (ref 10–40)
Albumin: 4.3 g/dL (ref 3.6–5.1)
Alkaline phosphatase (APISO): 52 U/L (ref 40–115)
BUN: 8 mg/dL (ref 7–25)
CO2: 29 mmol/L (ref 20–32)
Calcium: 9.3 mg/dL (ref 8.6–10.3)
Chloride: 104 mmol/L (ref 98–110)
Creat: 1.16 mg/dL (ref 0.60–1.35)
GFR, Est African American: 95 mL/min/{1.73_m2} (ref >=60)
GFR, Est Non African American: 82 mL/min/{1.73_m2} (ref >=60)
Globulin: 2.4 g/dL (calc) (ref 1.9–3.7)
Glucose, Bld: 192 mg/dL — ABNORMAL HIGH (ref 65–99)
Potassium: 5.1 mmol/L (ref 3.5–5.3)
SODIUM: 139 mmol/L (ref 135–146)
TOTAL PROTEIN: 6.7 g/dL (ref 6.1–8.1)
Total Bilirubin: 0.6 mg/dL (ref 0.2–1.2)

## 2017-11-14 LAB — HEMOGLOBIN A1C
HEMOGLOBIN A1C: 5.6 %{Hb} (ref <5.7)
MEAN PLASMA GLUCOSE: 114 (calc)
eAG (mmol/L): 6.3 (calc)

## 2017-11-14 LAB — LIPID PANEL
Cholesterol: 231 mg/dL — ABNORMAL HIGH (ref <200)
HDL: 41 mg/dL (ref >40)
LDL Cholesterol (Calc): 153 mg/dL (calc) — ABNORMAL HIGH
Non-HDL Cholesterol (Calc): 190 mg/dL (calc) — ABNORMAL HIGH (ref <130)
Total CHOL/HDL Ratio: 5.6 (calc) — ABNORMAL HIGH (ref <5.0)
Triglycerides: 206 mg/dL — ABNORMAL HIGH (ref <150)

## 2017-11-16 ENCOUNTER — Encounter: Payer: Self-pay | Admitting: Family Medicine

## 2017-11-16 DIAGNOSIS — R7309 Other abnormal glucose: Secondary | ICD-10-CM | POA: Insufficient documentation

## 2017-11-20 ENCOUNTER — Encounter: Payer: No Typology Code available for payment source | Admitting: Family Medicine

## 2017-11-25 ENCOUNTER — Ambulatory Visit (INDEPENDENT_AMBULATORY_CARE_PROVIDER_SITE_OTHER): Payer: No Typology Code available for payment source | Admitting: Family Medicine

## 2017-11-25 ENCOUNTER — Encounter: Payer: Self-pay | Admitting: Family Medicine

## 2017-11-25 VITALS — BP 124/76 | HR 56 | Temp 97.5°F | Resp 16 | Ht 74.0 in | Wt 218.0 lb

## 2017-11-25 DIAGNOSIS — M25562 Pain in left knee: Secondary | ICD-10-CM

## 2017-11-25 DIAGNOSIS — J4540 Moderate persistent asthma, uncomplicated: Secondary | ICD-10-CM | POA: Diagnosis not present

## 2017-11-25 DIAGNOSIS — F410 Panic disorder [episodic paroxysmal anxiety] without agoraphobia: Secondary | ICD-10-CM | POA: Diagnosis not present

## 2017-11-25 DIAGNOSIS — J3089 Other allergic rhinitis: Secondary | ICD-10-CM | POA: Diagnosis not present

## 2017-11-25 DIAGNOSIS — M2352 Chronic instability of knee, left knee: Secondary | ICD-10-CM

## 2017-11-25 DIAGNOSIS — Z0000 Encounter for general adult medical examination without abnormal findings: Secondary | ICD-10-CM | POA: Diagnosis not present

## 2017-11-25 DIAGNOSIS — R7309 Other abnormal glucose: Secondary | ICD-10-CM

## 2017-11-25 DIAGNOSIS — G8929 Other chronic pain: Secondary | ICD-10-CM | POA: Insufficient documentation

## 2017-11-25 DIAGNOSIS — J309 Allergic rhinitis, unspecified: Secondary | ICD-10-CM | POA: Insufficient documentation

## 2017-11-25 DIAGNOSIS — F411 Generalized anxiety disorder: Secondary | ICD-10-CM

## 2017-11-25 MED ORDER — FLUTICASONE PROPIONATE 50 MCG/ACT NA SUSP: 2.0000 | Freq: Every day | NASAL | 3 refills | Status: AC

## 2017-11-25 NOTE — Patient Instructions (Addendum)
Thank you for coming to the office today.  Elevated A1c 5.6 - concern for future pre-diabetes if not controlled.  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 They will likely check X-ray and pursue further treatment, may need an MRI - I am concerned about possible meniscus or ligament injury affecting stability of knee.  Use RICE therapy: - R - Rest / relative rest with activity modification avoid overuse of joint - I - Ice packs (make sure you use a towel or sock / something to protect skin) - C - Compression with flexible Knee Sleeve ACE wrap to apply pressure and reduce swelling allowing more support - E - Elevation - if significant swelling, lift leg above heart level (toes above your nose) to help reduce swelling, most helpful at night after day of being on your feet  [facility] [address] [place], [place]  [id number] Phone: [phone]   Please schedule a Follow-up Appointment to: Return in about 6 months (around 05/28/2018) for Check A1c, Lifestyle, Left Knee Pain (ortho f/u).  If you have any other questions or concerns, please feel free to call the office or send a message through MyChart. You may also schedule an earlier appointment if necessary.  Additionally, you may be receiving a survey about your experience at our office within a few days to 1 week by e-mail or mail. We value your feedback.  Saralyn PilarAlexander Cinnamon Morency, DO [facility],  [facility]

## 2017-11-25 NOTE — Progress Notes (Signed)
Subjective:    Patient ID: Jesse Blanchard, male    DOB: 1985/07/22, 32 y.o.   MRN: [id number]  Jesse PriestJonathan Blanchard is a 32 y.o. male presenting on 11/25/2017 for Annual Exam and Knee Pain (chronic left knee, old injury, now worsening)   HPI   Here for Annual Physical and Lab Review.  Elevated A1c Reports no prior history of PreDM or DM. He has strong family history of DM. Admits concerned about sugar. CBGs: Not checking Meds: Never on meds Currently not on ACEi / ARB Lifestyle: - He does work night shift and has difficulty with meals and extra snacks - Diet (Unbalanced meals, avg wake up 1-2pm, has some sporadic eating - Admits drinks a lot of gatorade and hydration drinks, sweet tea, some water) - Exercise (Limited due to time, no ambition to start exercise) Denies hypoglycemia  HYPERLIPIDEMIA: - Reports no prior concerns with cholesterol. Last lipid panel 11/2017, abnormal elevated TG and LDL, normal HDL - Not on cholesterol medicine See above lifestyle  Follow-up GAD with history of panic Last visit with me 09/2017, without new concerns, doing well on intermittent only Escitalopram 10-20mg  PRN 1-2 week before stressors. Also he has few pills left on Lorazepam still, very rarely taking it at all. No longer having panic attacks. - No new concerns today - Denies recent depression mood or panic  Asthma, moderate persistent Last visit 09/2017 with me, see note for updates, continues to take Singulair and Symbicort. Not using albuterol  Allergic Rhinitis Reports chronic sinus problems with congestion and allergies. He used to take nose sprays temporarily limited results, never seen ENT or other diagnostics. He is asking about congestion.  Additional concern today:  Chronic Left Knee Pain Reports old injury years ago, while working out for [employer], he was doing "mountain climber" exercise had one acute episode with a "pop" and had pain, gradually resolved. He did well for >9  years since injury, until past few years when he had gradual episodes of Left sided knee sharp knee pain episodes and may be walking and will get a sudden moment of instability with knee "hyperextending" does not cause him to fall or does not lock - Worse with increased stairs, causes some swelling. Not taking Tylenol or Ibuprofen. Or OTC meds - He has never had Knee evaluated or X-ray Denies new knee injury trauma or fall, ecchymosis or erythema, other joint pain   Health Maintenance: Due for Flu Shot, declines today despite counseling on benefits  Reported UTD routine HIV screen last 11/2016 at health department.  Depression screen [facility] 11/25/2017 10/12/2017 09/26/2016  Decreased Interest 0 0 0  Down, Depressed, Hopeless 0 0 0  PHQ - 2 Score 0 0 0   GAD 7 : Generalized Anxiety Score 10/13/2017 09/26/2016  Nervous, Anxious, on Edge 1 3  Control/stop worrying 1 2  Worry too much - different things 1 3  Trouble relaxing 2 3  Restless 0 2  Easily annoyed or irritable 0 0  Afraid - awful might happen 1 2  Total GAD 7 Score 6 15  Anxiety Difficulty Not difficult at all Not difficult at all     Past Medical History:  Diagnosis Date  . Asthma    Past Surgical History:  Procedure Laterality Date  . NO PAST SURGERIES     Social History   Socioeconomic History  . Marital status: Married    Spouse name: Not on file  . Number of children: Not on file  .  Years of education: [employer]  . Highest education level: Not on file  Occupational History  . 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 Financial resource strain: Not on file  . Food insecurity:    Worry: Not on file    Inability: Not on file  . Transportation needs:    Medical: Not on file    Non-medical: Not on file  Tobacco Use  . Smoking status: Never Smoker  . Smokeless tobacco: Current User    Types: Chew  . Tobacco comment: 1 can chewing tobacco daily, 15 years    Substance and Sexual Activity  . Alcohol use: Yes    Comment: occasionally weekends  . Drug use: No  . Sexual activity: Not on file  Lifestyle  . Physical activity:    Days per week: Not on file    Minutes per session: Not on file  . Stress: Not on file  Relationships  . Social connections:    Talks on phone: Not on file    Gets together: Not on file    Attends religious service: Not on file    Active member of club or organization: Not on file    Attends meetings of clubs or organizations: Not on file    Relationship status: Not on file  . Intimate partner violence:    Fear of current or ex partner: Not on file    Emotionally abused: Not on file    Physically abused: Not on file    Forced sexual activity: Not on file  Other Topics Concern  . Not on file  Social History Narrative  . Not on file   Family History  Problem Relation Age of Onset  . Diabetes Mother   . Heart disease Father   . Diabetes Father   . Heart attack Father 40       S/p CABG  . Heart disease Paternal Grandfather   . Heart attack Paternal Grandfather 60       repeat age 5 fatal  . Diabetes Paternal Grandfather   . Diabetes Sister   . Hypertension Sister   . Heart attack Maternal Grandmother 60  . Cancer Maternal Grandfather        Undetermined, possible GI malignancy  . Diabetes Paternal Uncle   . Prostate cancer Neg Hx    Current Outpatient Medications on File Prior to Visit  Medication Sig  . escitalopram (LEXAPRO) 20 MG tablet Take 1 tablet (20 mg total) by mouth daily.  Marland Kitchen LORazepam (ATIVAN) 0.5 MG tablet Take 1 tablet (0.5 mg total) by mouth daily as needed for anxiety.  . montelukast (SINGULAIR) 10 MG tablet Take 1 tablet (10 mg total) by mouth at bedtime.  Marland Kitchen PROAIR HFA 108 (90 Base) MCG/ACT inhaler Inhale 2 puffs into the lungs 1 day or 1 dose.  . SYMBICORT 160-4.5 MCG/ACT inhaler Inhale 2 puffs into the lungs 2 (two) times daily.   No current facility-administered medications on  file prior to visit.     Review of Systems  Constitutional: Negative for activity change, appetite change, chills, diaphoresis, fatigue and fever.  HENT: Positive for congestion and sinus pressure. Negative for hearing loss and postnasal drip.   Eyes: Negative for visual disturbance.  Respiratory: Negative for apnea, cough, choking, chest tightness, shortness of breath and wheezing.   Cardiovascular: Negative for chest pain, palpitations and leg swelling.  Gastrointestinal: Negative for abdominal pain, anal bleeding, blood in stool,  constipation, diarrhea, nausea and vomiting.  Endocrine: Negative for cold intolerance.  Genitourinary: Negative for decreased urine volume, difficulty urinating, dysuria, frequency, hematuria, testicular pain and urgency.  Musculoskeletal: Positive for arthralgias (Left knee episodes of pain). Negative for back pain and neck pain.  Skin: Negative for rash.  Allergic/Immunologic: Positive for environmental allergies.  Neurological: Negative for dizziness, weakness, light-headedness, numbness and headaches.  Hematological: Negative for adenopathy.  Psychiatric/Behavioral: Negative for behavioral problems, dysphoric mood and sleep disturbance. The patient is not nervous/anxious.    Per HPI unless specifically indicated above     Objective:    BP 124/76   Pulse (!) 56   Temp (!) 97.5 F (36.4 C) (Oral)   Resp 16   Ht 6\' 2"  (1.88 m)   Wt 218 lb (98.9 kg)   BMI 27.99 kg/m   Wt Readings from Last 3 Encounters:  11/25/17 218 lb (98.9 kg)  10/12/17 213 lb (96.6 kg)  09/26/16 210 lb (95.3 kg)    Physical Exam  Constitutional: He is oriented to person, place, and time. He appears well-developed and well-nourished. No distress.  Well-appearing, comfortable, cooperative  HENT:  Head: Normocephalic and atraumatic.  Mouth/Throat: Oropharynx is clear and moist.  Frontal / maxillary sinuses non-tender. R nasal passage patent widely without problem, L side with  evidence of deviated septum with more narrow nasal passage without congestion or obvious edema. Bilateral TMs clear without erythema, effusion or bulging. Oropharynx clear without erythema, exudates, edema or asymmetry.  Eyes: Pupils are equal, round, and reactive to light. Conjunctivae and EOM are normal. Right eye exhibits no discharge. Left eye exhibits no discharge.  Neck: Normal range of motion. Neck supple. No thyromegaly present.  Cardiovascular: Normal rate, regular rhythm, normal heart sounds and intact distal pulses.  No murmur heard. Pulmonary/Chest: Effort normal and breath sounds normal. No respiratory distress. He has no wheezes. He has no rales.  Abdominal: Soft. Bowel sounds are normal. He exhibits no distension and no mass. There is no tenderness.  Musculoskeletal: Normal range of motion. He exhibits no edema.  Upper / Lower Extremities: - Normal muscle tone, strength bilateral upper extremities 5/5, lower extremities 5/5  Left Knees Inspection: Normal appearance and symmetrical. No ecchymosis or effusion. Palpation: Mild +TTP Left knee only medial joint line. Bilateral crepitus. ROM: Full active ROM bilaterally Special Testing: Lachman / Valgus/Varus tests negative with intact ligaments (ACL, MCL, LCL), however some mild discomfort after repeated anterior/posterior drawer tests does not seem to have significant ligamentous laxity. McMurray positive on LEFT knee with pain and pop, negative on R. Standing Thessaly test with some discomfort but not exact reproduced pain on medial meniscus Strength: 5/5 intact knee flex/ext, ankle dorsi/plantarflex Neurovascular: distally intact sensation light touch and pulses   Lymphadenopathy:    He has no cervical adenopathy.  Neurological: He is alert and oriented to person, place, and time.  Distal sensation intact to light touch all extremities  Skin: Skin is warm and dry. No rash noted. He is not diaphoretic. No erythema.  Psychiatric:  He has a normal mood and affect. His behavior is normal.  Well groomed, good eye contact, normal speech and thoughts  Nursing note and vitals reviewed.  Results for orders placed or performed in visit on 11/13/17  Lipid panel  Result Value Ref Range   Cholesterol 231 (H) <200 mg/dL   HDL 41 >16 mg/dL   Triglycerides 109 (H) <150 mg/dL   LDL Cholesterol (Calc) 153 (H) mg/dL (calc)   Total CHOL/HDL  Ratio 5.6 (H) <5.0 (calc)   Non-HDL Cholesterol (Calc) 190 (H) <130 mg/dL (calc)  COMPLETE METABOLIC PANEL WITH GFR  Result Value Ref Range   Glucose, Bld 192 (H) 65 - 99 mg/dL   BUN 8 7 - 25 mg/dL   Creat 9.14 7.82 - 9.56 mg/dL   GFR, Est Non African American 82 > OR = 60 mL/min/1.101m2   GFR, Est African American 95 > OR = 60 mL/min/1.80m2   BUN/Creatinine Ratio NOT APPLICABLE 6 - 22 (calc)   Sodium 139 135 - 146 mmol/L   Potassium 5.1 3.5 - 5.3 mmol/L   Chloride 104 98 - 110 mmol/L   CO2 29 20 - 32 mmol/L   Calcium 9.3 8.6 - 10.3 mg/dL   Total Protein 6.7 6.1 - 8.1 g/dL   Albumin 4.3 3.6 - 5.1 g/dL   Globulin 2.4 1.9 - 3.7 g/dL (calc)   AG Ratio 1.8 1.0 - 2.5 (calc)   Total Bilirubin 0.6 0.2 - 1.2 mg/dL   Alkaline phosphatase (APISO) 52 40 - 115 U/L   AST 23 10 - 40 U/L   ALT 46 9 - 46 U/L  CBC with Differential/Platelet  Result Value Ref Range   WBC 6.4 3.8 - 10.8 Thousand/uL   RBC 5.05 4.20 - 5.80 Million/uL   Hemoglobin 15.7 13.2 - 17.1 g/dL   HCT 21.3 08.6 - 57.8 %   MCV 91.5 80.0 - 100.0 fL   MCH 31.1 27.0 - 33.0 pg   MCHC 34.0 32.0 - 36.0 g/dL   RDW 46.9 62.9 - 52.8 %   Platelets 235 140 - 400 Thousand/uL   MPV 10.5 7.5 - 12.5 fL   Neutro Abs 3,149 1,500 - 7,800 cells/uL   Lymphs Abs 2,112 850 - 3,900 cells/uL   WBC mixed population 461 200 - 950 cells/uL   Eosinophils Absolute 589 (H) 15 - 500 cells/uL   Basophils Absolute 90 0 - 200 cells/uL   Neutrophils Relative % 49.2 %   Total Lymphocyte 33.0 %   Monocytes Relative 7.2 %   Eosinophils Relative 9.2 %    Basophils Relative 1.4 %  Hemoglobin A1c  Result Value Ref Range   Hgb A1c MFr Bld 5.6 <5.7 % of total Hgb   Mean Plasma Glucose 114 (calc)   eAG (mmol/L) 6.3 (calc)   Recent Labs    11/13/17 1133  HGBA1C 5.6      Assessment & Plan:   Problem List Items Addressed This Visit    Allergic rhinitis due to allergen    Chronic problem with sinuses, likely trigger allergy Suspect some deviated septum as well narrowing L side nasal passage No evidence of bacterial infection sinusitis at this time Trial >6 weeks on Flonase Follow-up if not improving in future - consider refer ENT in future - may need adv imaging      Relevant Medications   fluticasone (FLONASE) 50 MCG/ACT nasal spray   Asthma, moderate persistent    Stable, well controlled on singulair / symbicort - consistent w/ moderate persistent asthma -- No recent ED or hospitalizations, or night-time awakening  Plan: 1. Continue current therapy - Singulair 10mg  nightly, Symbicort maintenance, Albuterol PRN rescue - advised to limit use avoid daily use      Chronic pain of left knee    Chronic gradually worsening and inc frequency L sided medial Knee pain and swelling  Old injury >9 years ago with exercising, had acute pop and injury, but has been stable for years until  worsening now. No new injury. - No prior imaging or diagnosis Concern for meniscus vs ligament injury given history twisting injury mechanism, especially with episodes of swelling and instability worse with stairs - Able to bear weight and ambulate - No prior history of knee surgery, arthroscopy - Inadequate conservative therapy. Failed knee brace  Plan: 1. Given chronicity of problem and exam suggestive of possible meniscus vs ligamentous injury - will refer to orthopedics - Sent referral to [facility] for further evaluation and management, deferred X-ray today given no acute injury or new problem - May take NSAID, Tylenol, RICE therapy -  Otherwise defer PT as well given concern for more structural injury and will defer to Ortho management      Relevant Orders   Ambulatory referral to Orthopedic Surgery   Elevated hemoglobin A1c    Elevated A1c to 5.6, no prior readings available. At risk with fam history of diabetes Concern with HLD  Plan:  1. Not on any therapy currently  2. Encourage improved lifestyle - low carb, low sugar diet, reduce portion size, continue improving regular exercise if able - limited by night shift - diet handouts given, he has declined nutritionist 3. Follow-up 6 months A1c check       Generalized anxiety disorder with panic attacks    Remains significantly improved overall GAD, now on intermittent SSRI escitalopram, rarely use BDZ Lorazepam History of rare panic attack -GAD7: 15 > improved to 6. No evidence of co-morbid depression - No prior dx / Psych / counseling  Plan: 1. Remain off meds - may take SSRI Escitalopram 10 or 20mg  (half or whole tab) daily for 1-2 weeks if need in future, taper off as indicated - May use Lorapzeam existing rx remaining few pills PRN only - can refill as temporary as long as very limited #10 pills or less, goal to remain off in future - Future may benefit from therapist if worse anxiety       Other Visit Diagnoses    Annual physical exam    -  Primary Updated Health Maintenance information Reviewed recent lab results with patient Encouraged improvement to lifestyle with diet and exercise    Recurrent left knee instability       See A&P for L knee pain, refer to Ortho   Relevant Orders   Ambulatory referral to Orthopedic Surgery      Meds ordered this encounter  Medications  . fluticasone (FLONASE) 50 MCG/ACT nasal spray    Sig: Place 2 sprays into both nostrils daily. Use for 4-6 weeks then stop and use seasonally or as needed.    Dispense:  16 g    Refill:  3   Orders Placed This Encounter  Procedures  . Ambulatory referral to Orthopedic  Surgery    Referral Priority:   Routine    Referral Type:   Surgical    Referral Reason:   Specialty Services Required    Referred to Provider:   Donato Heinz, MD    Requested Specialty:   Orthopedic Surgery    Number of Visits Requested:   1    Follow up plan: Return in about 6 months (around 05/28/2018) for Check A1c, Lifestyle, Left Knee Pain (ortho f/u).  Saralyn Pilar, DO [facility] 11/25/2017, 1:27 PM

## 2017-11-25 NOTE — Assessment & Plan Note (Signed)
Remains significantly improved overall GAD, now on intermittent SSRI escitalopram, rarely use BDZ Lorazepam History of rare panic attack -GAD7: 15 > improved to 6. No evidence of co-morbid depression - No prior dx / Psych / counseling  Plan: 1. Remain off meds - may take SSRI Escitalopram 10 or 20mg  (half or whole tab) daily for 1-2 weeks if need in future, taper off as indicated - May use Lorapzeam existing rx remaining few pills PRN only - can refill as temporary as long as very limited #10 pills or less, goal to remain off in future - Future may benefit from therapist if worse anxiety

## 2017-11-25 NOTE — Assessment & Plan Note (Signed)
Chronic problem with sinuses, likely trigger allergy Suspect some deviated septum as well narrowing L side nasal passage No evidence of bacterial infection sinusitis at this time Trial >6 weeks on Flonase Follow-up if not improving in future - consider refer ENT in future - may need adv imaging

## 2017-11-25 NOTE — Assessment & Plan Note (Signed)
Elevated A1c to 5.6, no prior readings available. At risk with fam history of diabetes Concern with HLD  Plan:  1. Not on any therapy currently  2. Encourage improved lifestyle - low carb, low sugar diet, reduce portion size, continue improving regular exercise if able - limited by night shift - diet handouts given, he has declined nutritionist 3. Follow-up 6 months A1c check

## 2017-11-25 NOTE — Assessment & Plan Note (Signed)
Stable, well controlled on singulair / symbicort - consistent w/ moderate persistent asthma -- No recent ED or hospitalizations, or night-time awakening  Plan: 1. Continue current therapy - Singulair 10mg  nightly, Symbicort maintenance, Albuterol PRN rescue - advised to limit use avoid daily use

## 2017-11-25 NOTE — Assessment & Plan Note (Signed)
Chronic gradually worsening and inc frequency L sided medial Knee pain and swelling  Old injury >9 years ago with exercising, had acute pop and injury, but has been stable for years until worsening now. No new injury. - No prior imaging or diagnosis Concern for meniscus vs ligament injury given history twisting injury mechanism, especially with episodes of swelling and instability worse with stairs - Able to bear weight and ambulate - No prior history of knee surgery, arthroscopy - Inadequate conservative therapy. Failed knee brace  Plan: 1. Given chronicity of problem and exam suggestive of possible meniscus vs ligamentous injury - will refer to orthopedics - [facility] and [facility], deferred X-ray today given no acute injury or new problem - May take NSAID, Tylenol, RICE therapy - Otherwise defer PT as well given concern for more structural injury and will defer to Ortho management

## 2017-12-04 ENCOUNTER — Other Ambulatory Visit: Payer: Self-pay | Admitting: Orthopedic Surgery

## 2017-12-04 DIAGNOSIS — M25562 Pain in left knee: Secondary | ICD-10-CM

## 2017-12-04 DIAGNOSIS — G8929 Other chronic pain: Secondary | ICD-10-CM

## 2017-12-18 ENCOUNTER — Ambulatory Visit: Payer: No Typology Code available for payment source

## 2018-05-28 ENCOUNTER — Encounter: Payer: Self-pay | Admitting: Family Medicine

## 2018-05-28 ENCOUNTER — Other Ambulatory Visit: Payer: Self-pay | Admitting: Family Medicine

## 2018-05-28 ENCOUNTER — Ambulatory Visit (INDEPENDENT_AMBULATORY_CARE_PROVIDER_SITE_OTHER): Payer: No Typology Code available for payment source | Admitting: Family Medicine

## 2018-05-28 VITALS — BP 126/83 | HR 73 | Temp 98.4°F | Ht 74.0 in | Wt 225.0 lb

## 2018-05-28 DIAGNOSIS — F410 Panic disorder [episodic paroxysmal anxiety] without agoraphobia: Secondary | ICD-10-CM

## 2018-05-28 DIAGNOSIS — J4540 Moderate persistent asthma, uncomplicated: Secondary | ICD-10-CM

## 2018-05-28 DIAGNOSIS — R7309 Other abnormal glucose: Secondary | ICD-10-CM

## 2018-05-28 DIAGNOSIS — G8929 Other chronic pain: Secondary | ICD-10-CM

## 2018-05-28 DIAGNOSIS — M722 Plantar fascial fibromatosis: Secondary | ICD-10-CM

## 2018-05-28 DIAGNOSIS — Z0000 Encounter for general adult medical examination without abnormal findings: Secondary | ICD-10-CM

## 2018-05-28 DIAGNOSIS — F411 Generalized anxiety disorder: Secondary | ICD-10-CM

## 2018-05-28 DIAGNOSIS — M25562 Pain in left knee: Secondary | ICD-10-CM

## 2018-05-28 DIAGNOSIS — L723 Sebaceous cyst: Secondary | ICD-10-CM

## 2018-05-28 DIAGNOSIS — E782 Mixed hyperlipidemia: Secondary | ICD-10-CM | POA: Insufficient documentation

## 2018-05-28 LAB — POCT GLYCOSYLATED HEMOGLOBIN (HGB A1C): HEMOGLOBIN A1C: 5.6 % (ref 4.0–5.6)

## 2018-05-28 NOTE — Assessment & Plan Note (Signed)
Consistent with plantar fasciitis Limited improve on conservative care, stretching, insoles  Referral to Podiatry

## 2018-05-28 NOTE — Assessment & Plan Note (Signed)
Elevated A1c to 5.6 stable At risk with fam history of diabetes Concern with HLD  Plan:  1. Not on any therapy currently  2. Encourage improved lifestyle - low carb, low sugar diet, reduce portion size, continue improving regular exercise if able - limited by night shift 3. Follow-up 6 months annual phys

## 2018-05-28 NOTE — Assessment & Plan Note (Signed)
Remains significantly improved overall GAD, still on intermittent SSRI escitalopram rarely use BDZ Lorazepam History of rare panic attack -GAD7: 6 > 7. No evidence of co-morbid depression - No prior dx / Psych / counseling  Plan: 1. Still may take SSRI Escitalopram 10 or 20mg  (half or whole tab) daily for 1-2 weeks if need in future, taper off as indicated - May use Lorapzeam existing rx remaining few pills PRN only - can refill as temporary as long as very limited #10 pills or less, goal to remain off in future - Future may benefit from therapist if worse anxiety

## 2018-05-28 NOTE — Patient Instructions (Signed)
NOTE - Chart was completed after office visit - due to no access to internet / CHL.  AVS instructions were hand written and given to patient during office visit on 05/28/18.  

## 2018-05-28 NOTE — Assessment & Plan Note (Signed)
Stable chronic L knee pain Likely meniscus injury Established with [facility] Dr Rosita Kea Follow-up with them in future as planned

## 2018-05-28 NOTE — Progress Notes (Signed)
Subjective:    Patient ID: Jesse Blanchard, male    DOB: 1985-12-30, 33 y.o.   MRN: [id number]  Jesse Blanchard is a 33 y.o. male presenting on 05/28/2018 for Anxiety and Hyperglycemia   HPI  Elevated A1c Reports no prior history of PreDM or DM. He has strong family history of DM Last A1c 5.6 CBGs: Not checking Meds: Never on meds Currently not on ACEi / ARB Lifestyle: - Diet (Trying to improve diet, but limited due to meal prep limitations) - Exercise (Limited due to time, no ambition to start exercise) Denies hypoglycemia  Follow-up GAD with history of panic Last visit with me 11/2017, without new concerns, doing well on intermittent only Escitalopram 10-20mg  PRN 1-2 week before stressors. Also he has few pills left on Lorazepam still, very rarely taking it at all. No longer having panic attacks. - No new concerns today - Denies recent depression mood or panic  Chronic L Knee Pain Has been seen by [facility] Dr Rosita Kea. Diagnosed with likely meniscus injury, he is going to follow-up with them in future.  Bilateral Plantar Fasciitis Persistent pain in feet, heels, worse with prolong walk, tried appropriate footwear and insoles. Limited benefit, stretching daily. Request referral. Denies any injury.  Cyst on back Reports L upper back area localized with prior swollen cyst has drained before, not really painful, asking about it today.  Depression screen [facility] 2/9 11/25/2017 10/12/2017 09/26/2016  Decreased Interest 0 0 0  Down, Depressed, Hopeless 0 0 0  PHQ - 2 Score 0 0 0   GAD 7 : Generalized Anxiety Score 05/28/2018 10/13/2017 09/26/2016  Nervous, Anxious, on Edge 0 1 3  Control/stop worrying 0 1 2  Worry too much - different things 3 1 3   Trouble relaxing 1 2 3   Restless 1 0 2  Easily annoyed or irritable 2 0 0  Afraid - awful might happen 0 1 2  Total GAD 7 Score 7 6 15   Anxiety Difficulty Somewhat difficult Not difficult at all Not difficult at all     Social History   Tobacco  Use  . Smoking status: Never Smoker  . Smokeless tobacco: Current User    Types: Chew  . Tobacco comment: 1 can chewing tobacco daily, 15 years  Substance Use Topics  . Alcohol use: Yes    Comment: occasionally weekends  . Drug use: No    Review of Systems Per HPI unless specifically indicated above     Objective:    BP 126/83 (BP Location: Left Arm, Patient Position: Sitting, Cuff Size: Normal)   Pulse 73   Temp 98.4 F (36.9 C) (Oral)   Ht 6\' 2"  (1.88 m)   Wt 225 lb (102.1 kg)   BMI 28.89 kg/m   Wt Readings from Last 3 Encounters:  05/28/18 225 lb (102.1 kg)  11/25/17 218 lb (98.9 kg)  10/12/17 213 lb (96.6 kg)    Physical Exam Vitals signs and nursing note reviewed.  Constitutional:      General: He is not in acute distress.    Appearance: He is well-developed. He is not diaphoretic.     Comments: Well-appearing, comfortable, cooperative  HENT:     Head: Normocephalic and atraumatic.  Eyes:     General:        Right eye: No discharge.        Left eye: No discharge.     Conjunctiva/sclera: Conjunctivae normal.  Cardiovascular:     Rate and Rhythm: Normal rate.  Pulmonary:     Effort: Pulmonary effort is normal.  Musculoskeletal:     Comments: Bilateral feet normal appearance. Reproducible pain at heel.  Skin:    General: Skin is warm and dry.     Findings: No erythema or rash.     Comments: Left upper back localized palpable < 1 cm nodular cyst without induration tender erythema  Neurological:     Mental Status: He is alert and oriented to person, place, and time.  Psychiatric:        Behavior: Behavior normal.     Comments: Well groomed, good eye contact, normal speech and thoughts    Recent Labs    11/13/17 1133 05/28/18 1836  HGBA1C 5.6 5.6    Results for orders placed or performed in visit on 05/28/18  POCT glycosylated hemoglobin (Hb A1C)  Result Value Ref Range   Hemoglobin A1C 5.6 4.0 - 5.6 %      Assessment & Plan:   Problem List  Items Addressed This Visit    Bilateral plantar fasciitis    Consistent with plantar fasciitis Limited improve on conservative care, stretching, insoles  Referral to Podiatry      Relevant Orders   Ambulatory referral to Podiatry   Chronic pain of left knee    Stable chronic L knee pain Likely meniscus injury Established with [facility] Dr Rosita Kea Follow-up with them in future as planned      Elevated hemoglobin A1c - Primary    Elevated A1c to 5.6 stable At risk with fam history of diabetes Concern with HLD  Plan:  1. Not on any therapy currently  2. Encourage improved lifestyle - low carb, low sugar diet, reduce portion size, continue improving regular exercise if able - limited by night shift 3. Follow-up 6 months annual phys      Relevant Orders   POCT glycosylated hemoglobin (Hb A1C) (Completed)   Generalized anxiety disorder with panic attacks    Remains significantly improved overall GAD, still on intermittent SSRI escitalopram rarely use BDZ Lorazepam History of rare panic attack -GAD7: 6 > 7. No evidence of co-morbid depression - No prior dx / Psych / counseling  Plan: 1. Still may take SSRI Escitalopram 10 or 20mg  (half or whole tab) daily for 1-2 weeks if need in future, taper off as indicated - May use Lorapzeam existing rx remaining few pills PRN only - can refill as temporary as long as very limited #10 pills or less, goal to remain off in future - Future may benefit from therapist if worse anxiety      Sebaceous cyst    Stable without swelling or 2nd complication Reassurance. No infection Follow-up if need can refer to Gen Surg for more long term excision         No orders of the defined types were placed in this encounter.   Orders Placed This Encounter  Procedures  . Ambulatory referral to Podiatry    Referral Priority:   Routine    Referral Type:   Consultation    Referral Reason:   Specialty Services Required    Requested Specialty:   Podiatry     Number of Visits Requested:   1  . POCT glycosylated hemoglobin (Hb A1C)    Follow up plan: Return in about 6 months (around 11/26/2018) for Annual Physical.  Future labs ordered for 11/2018  Saralyn Pilar, DO [facility] 05/28/2018, 6:27 PM

## 2018-05-28 NOTE — Assessment & Plan Note (Signed)
Stable without swelling or 2nd complication Reassurance. No infection Follow-up if need can refer to Gen Surg for more long term excision

## 2018-06-14 ENCOUNTER — Encounter: Payer: Self-pay | Admitting: Family Medicine

## 2018-06-14 ENCOUNTER — Ambulatory Visit (INDEPENDENT_AMBULATORY_CARE_PROVIDER_SITE_OTHER): Payer: Managed Care, Other (non HMO) | Admitting: Family Medicine

## 2018-06-14 ENCOUNTER — Other Ambulatory Visit: Payer: Self-pay

## 2018-06-14 VITALS — BP 132/83 | HR 86 | Temp 99.2°F | Resp 16 | Ht 72.0 in | Wt 221.5 lb

## 2018-06-14 DIAGNOSIS — R509 Fever, unspecified: Secondary | ICD-10-CM

## 2018-06-14 DIAGNOSIS — J111 Influenza due to unidentified influenza virus with other respiratory manifestations: Secondary | ICD-10-CM

## 2018-06-14 LAB — POCT INFLUENZA A/B
INFLUENZA B, POC: NEGATIVE
Influenza A, POC: NEGATIVE

## 2018-06-14 MED ORDER — OSELTAMIVIR PHOSPHATE 75 MG PO CAPS: 75.0000 mg | ORAL_CAPSULE | Freq: Two times a day (BID) | ORAL | 0 refills | Status: DC

## 2018-06-14 MED ORDER — BALOXAVIR MARBOXIL(80 MG DOSE) 2 X 40 MG PO TBPK: 80.0000 mg | ORAL_TABLET | Freq: Once | ORAL | 0 refills | Status: AC

## 2018-06-14 NOTE — Patient Instructions (Addendum)
Thank you for coming to the office today.  Your flu test was NEGATIVE, this is not 100% though, and you can still have the flu with a negative test, otherwise it could be a different viral syndrome.  - Start Flu medicine today - prefer Xofluza 2 pills back to back at one time (one day and then done) - If cost too high nased on insurance / cost coverage - try Tamiflu (anti-flu medicine) take one capsule 75mg  twice a day for 5 days   - For symptom control (these are optional OTC medicines)      - Take Ibuprofen / Advil 400-600mg  every 6-8 hours as needed for fever / muscle aches, and may also take Tylenol 500-1000mg  per dose every 6-8 hours or 3 times a day, can alternate dosing     - May try OTC Mucinex up to 7-10 days then stop  - Wash hands and cover cough very well to avoid spread of infection - Improve hydration with plenty of clear fluids  We can send prednisone steroid for asthma if not improved by end of week   If significant worsening with poor fluid intake, worsening fever, difficulty breathing due to coughing, worsening body aches, weakness, or other more concerning symptoms difficulty breathing you can seek treatment at Emergency Department. Also if improved flu symptoms and then worsening days to week later with concerns for bronchitis, productive cough fever chills again we may need to check for possible pneumonia that can occur after the flu     Please schedule a Follow-up Appointment to: Return in about 1 week (around 06/21/2018), or if symptoms worsen or fail to improve, for flu.  If you have any other questions or concerns, please feel free to call the office or send a message through MyChart. You may also schedule an earlier appointment if necessary.  Additionally, you may be receiving a survey about your experience at our office within a few days to 1 week by e-mail or mail. We value your feedback.  Saralyn Pilar, DO [facility], [place]

## 2018-06-14 NOTE — Progress Notes (Signed)
Subjective:    Patient ID: Jesse Blanchard, male    DOB: 10/03/1985, 33 y.o.   MRN: [id number]  Jesse Blanchard is a 33 y.o. male presenting on 06/14/2018 for Fever (bodyache,cough, nausesa and 102.7 fever )  Patient presents for a same day appointment.   HPI   INFLUENZA Reports onset since Saturday 2 days ago with fluctuating fever up to 102.60F and down to 65F, with progressive worsening cold and flulike symptoms. He has significant sick contacts at work multiple co workers diagnosed with the flu. - Taking Tylenol, NyQuil, Vitamin C - Admits bodyaches - Admits nausea vomiting today otherwise reduced appetite, some diarrhea - Admits some sinus pressure and congestion  Health Maintenance: - No flu vaccine this season.  Depression screen [facility] 2/9 11/25/2017 10/12/2017 09/26/2016  Decreased Interest 0 0 0  Down, Depressed, Hopeless 0 0 0  PHQ - 2 Score 0 0 0    Social History   Tobacco Use  . Smoking status: Never Smoker  . Smokeless tobacco: Current User    Types: Chew  . Tobacco comment: 1 can chewing tobacco daily, 15 years  Substance Use Topics  . Alcohol use: Yes    Comment: occasionally weekends  . Drug use: No    Review of Systems Per HPI unless specifically indicated above     Objective:    BP 132/83   Pulse 86   Temp 99.2 F (37.3 C) (Oral)   Resp 16   Ht 6' (1.829 m)   Wt 221 lb 8 oz (100.5 kg)   SpO2 97%   BMI 30.04 kg/m   Wt Readings from Last 3 Encounters:  06/14/18 221 lb 8 oz (100.5 kg)  05/28/18 225 lb (102.1 kg)  11/25/17 218 lb (98.9 kg)    Physical Exam Vitals signs and nursing note reviewed.  Constitutional:      General: He is not in acute distress.    Appearance: He is well-developed. He is not diaphoretic.     Comments: Mostly well but tired-appearing, comfortable, cooperative  HENT:     Head: Normocephalic and atraumatic.     Comments: Frontal / maxillary sinuses non-tender. Nares patient without congestion    Bilateral TMs clear  without erythema, effusion or bulging. Oropharynx clear without erythema, exudates, edema or asymmetry. Eyes:     General:        Right eye: No discharge.        Left eye: No discharge.     Conjunctiva/sclera: Conjunctivae normal.  Neck:     Musculoskeletal: Normal range of motion and neck supple.     Thyroid: No thyromegaly.  Cardiovascular:     Rate and Rhythm: Normal rate and regular rhythm.     Heart sounds: Normal heart sounds. No murmur.  Pulmonary:     Effort: Pulmonary effort is normal. No respiratory distress.     Breath sounds: Normal breath sounds. No wheezing or rales.     Comments: Occasional cough Musculoskeletal: Normal range of motion.  Lymphadenopathy:     Cervical: No cervical adenopathy.  Skin:    General: Skin is warm and dry.     Findings: No erythema or rash.  Neurological:     Mental Status: He is alert and oriented to person, place, and time.  Psychiatric:        Behavior: Behavior normal.     Comments: Well groomed, good eye contact, normal speech and thoughts    Results for orders placed or performed in visit on  06/14/18  POCT Influenza A/B  Result Value Ref Range   Influenza A, POC Negative Negative   Influenza B, POC Negative Negative      Assessment & Plan:   Problem List Items Addressed This Visit    None    Visit Diagnoses    Influenza    -  Primary   Relevant Medications   Baloxavir Marboxil,80 MG Dose, (XOFLUZA) 2 x 40 MG TBPK   oseltamivir (TAMIFLU) 75 MG capsule   Fever and chills       Relevant Orders   POCT Influenza A/B (Completed)      Clinically diagnosed influenza despite negative rapid flu test today, concern for flu still due to significant known exposure to positive influenza - Duration x 2 days, without complication. - Able to tolerate some PO and fluid intake - No other focal findings of infection today - Did not receive influenza vaccine this season  Plan: 1. Start EMPIRIC Xofluza 80mg  (x 2 of 40mg  dose) new rx  sent to pharmacy, copay discount card given, also back up plan printed  Tamiflu 75mg  capsules BID x 5 days, only if could not get xofluza - ONLY TAKE 1 FLU MEDICINE 2. Supportive care as advised with NSAID / Tylenol PRN fever/myalgias, improve hydration, may take OTC Cold/Flu meds 3. Offered but he declined, Zofran ODT PRN nausea/vomiting 4. Return criteria given if significant worsening, consider post-influenza complications, otherwise follow-up if needed  Note written for work   Meds ordered this encounter  Medications  . Baloxavir Marboxil,80 MG Dose, (XOFLUZA) 2 x 40 MG TBPK    Sig: Take 80 mg by mouth once for 1 dose. For Flu    Dispense:  1 each    Refill:  0  . oseltamivir (TAMIFLU) 75 MG capsule    Sig: Take 1 capsule (75 mg total) by mouth 2 (two) times daily. For 5 days    Dispense:  10 capsule    Refill:  0     Follow up plan: Return in about 1 week (around 06/21/2018), or if symptoms worsen or fail to improve, for flu.   Saralyn Pilar, DO [facility] 06/14/2018, 9:52 AM

## 2018-07-09 IMAGING — DX DG CHEST 1V PORT
1 series · 1 of 1 positions shown · non-contrast
Comparison: None.

CLINICAL DATA: Chest pain and shortness of breath. Symptoms for 1
month, progressive.

EXAM:
PORTABLE CHEST 1 VIEW

[chest ap]
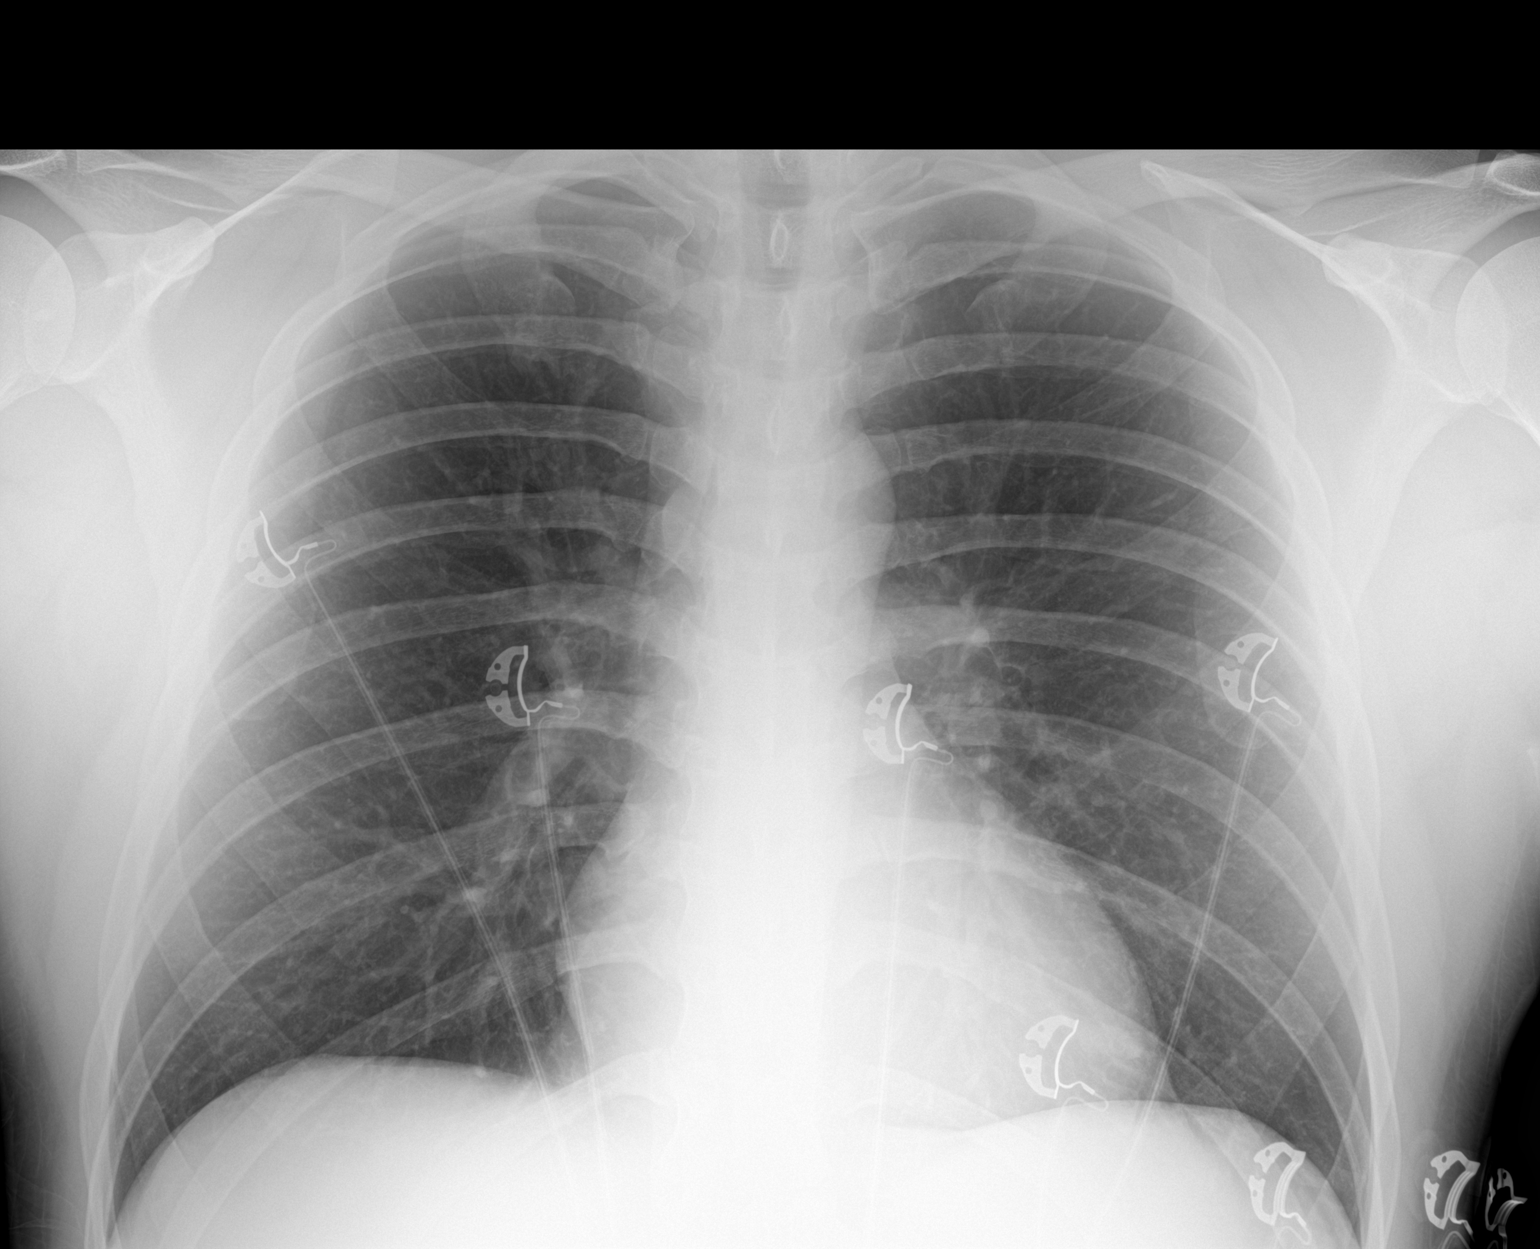

[1 of 1 positions shown; findings below may reference images not displayed]

FINDINGS: The cardiomediastinal contours are normal. The lungs are clear.
Pulmonary vasculature is normal. No consolidation, pleural effusion,
or pneumothorax. No acute osseous abnormalities are seen.
IMPRESSION: No acute pulmonary process.

## 2018-08-12 ENCOUNTER — Other Ambulatory Visit: Payer: Self-pay | Admitting: Family Medicine

## 2018-08-12 DIAGNOSIS — J4540 Moderate persistent asthma, uncomplicated: Secondary | ICD-10-CM

## 2018-11-08 ENCOUNTER — Other Ambulatory Visit: Payer: Self-pay | Admitting: Family Medicine

## 2018-11-08 DIAGNOSIS — J4540 Moderate persistent asthma, uncomplicated: Secondary | ICD-10-CM

## 2018-12-20 ENCOUNTER — Other Ambulatory Visit: Payer: Self-pay

## 2018-12-20 DIAGNOSIS — Z20822 Contact with and (suspected) exposure to covid-19: Secondary | ICD-10-CM

## 2018-12-22 LAB — NOVEL CORONAVIRUS, NAA: SARS-CoV-2, NAA: NOT DETECTED

## 2018-12-24 ENCOUNTER — Encounter: Payer: Self-pay | Admitting: Family Medicine

## 2018-12-24 ENCOUNTER — Ambulatory Visit (INDEPENDENT_AMBULATORY_CARE_PROVIDER_SITE_OTHER): Payer: Managed Care, Other (non HMO) | Admitting: Family Medicine

## 2018-12-24 ENCOUNTER — Other Ambulatory Visit: Payer: Self-pay

## 2018-12-24 DIAGNOSIS — F410 Panic disorder [episodic paroxysmal anxiety] without agoraphobia: Secondary | ICD-10-CM

## 2018-12-24 DIAGNOSIS — R42 Dizziness and giddiness: Secondary | ICD-10-CM

## 2018-12-24 DIAGNOSIS — F411 Generalized anxiety disorder: Secondary | ICD-10-CM

## 2018-12-24 NOTE — Progress Notes (Signed)
Virtual Visit via Telephone The purpose of this virtual visit is to provide medical care while limiting exposure to the novel coronavirus (COVID19) for both patient and office staff.  Consent was obtained for phone visit:  Yes.   Answered questions that patient had about telehealth interaction:  Yes.   I discussed the limitations, risks, security and privacy concerns of performing an evaluation and management service by telephone. I also discussed with the patient that there may be a patient responsible charge related to this service. The patient expressed understanding and agreed to proceed.  Patient Location: Home Provider Location: [facility])   ---------------------------------------------------------------------- Chief Complaint  Patient presents with  . Nausea    light headache onset week  . Anxiety    S: Reviewed CMA documentation. I have called patient and gathered additional HPI as follows:  DIZZINESS / NAUSEA / ANXIETY Reports that symptoms started last week Thursday, he was leaving work to run errand and he felt a sudden onset dizziness and anxious with nausea, seemed to be episodic over weekend. He contacted [place] on Monday 12/20/18, and scheduled - it now currently seems like his symptoms are significantly improving gradually. - He says some anxiety triggered, but no acute life stressors or other factors, last week he restarted taking Escitalopram 20mg  daily week ago, previously was only taking intermittent if needed - He also admits legs were more sore, and some paresthesia - Denies any fevers, chills, bodyache, respiratory symptoms cough or short of breath  Denies any high risk travel to areas of current concern for COVID19. Denies any known or suspected exposure to person with or possibly with COVID19.  Denies any fevers, chills, sweats, body ache, cough, shortness of breath, sinus pain or pressure, headache, abdominal pain,  diarrhea  -------------------------------------------------------------------------- O: No physical exam performed due to remote telephone encounter.  -------------------------------------------------------------------------- A&P:   Dizziness/ Nausea / Anxiety Suspected possible vertigo vs dizziness secondary to anxiety Clinically not consistent with infection based on history Negative COVID19 reassuring  1. Reassurance, close monitoring 2. Trial of BPPV treatment - Epley Maneuver per AVS handout 3. May try OTC Drammamine / Meclizine PRN 4. Defer anti-emetic since improved 5. Continue lexapro - may benefit with longer treatment course for few weeks 6. Continue rare use of Lorazepam, has 6 pills left from 10 given >1 year ago, may refill in future if need for acute panic   No orders of the defined types were placed in this encounter.    If symptoms do not resolve or significantly improve OR if WORSENING - fever / cough - or worsening shortness of breath - then should contact us and seek advice on next steps in treatment at home vs where/when to seek care at Urgent Care or Hospital ED for further intervention and possible testing if indicated.  Patient verbalizes understanding with the above medical recommendations including the limitation of remote medical advice.  Specific follow-up / call-back criteria were given for patient to follow-up or seek medical care more urgently if needed.   - Time spent in direct consultation with patient on phone: 9 minutes   Nobie Putnam, [facility] 12/24/2018, 2:35 PM

## 2018-12-24 NOTE — Patient Instructions (Addendum)
Thank you for coming to the office today.  Most likely your symptoms are anxiety related  Continue lexapro for now, we can adjust in future if affecting your sexual function  Let me know if need more lorazepam.  It could also be inner ear / Vertigo (Benign Paroxysmal Positional Vertigo) - This is commonly caused by inner ear fluid imbalance, sometimes can be worsened by allergies and sinus symptoms, otherwise it can occur randomly sometimes and we may never discover the exact cause. - To treat this, try the Epley Manuever (see diagrams/instructions below) at home up to 3 times a day for 1-2 weeks or until symptoms resolve - You may take Drammamine OTC /  Meclizine as needed up to 3 times a day for dizziness, this will not cure symptoms but may help. Caution may make you drowsy.  If you develop significant worsening episode with vertigo that does not improve and you get severe headache, loss of vision, arm or leg weakness, slurred speech, or other concerning symptoms please seek immediate medical attention at Emergency Department.  See the next page for images describing the Epley Manuever.     ----------------------------------------------------------------------------------------------------------------------        DUE for FASTING BLOOD WORK (no food or drink after midnight before the lab appointment, only water or coffee without cream/sugar on the morning of)  SCHEDULE "Lab Only" visit in the morning at the clinic for lab draw in 1-2 WEEKS   - Make sure Lab Only appointment is at about 1 week before your next appointment, so that results will be available  For Lab Results, once available within 2-3 days of blood draw, you can can log in to MyChart online to view your results and a brief explanation. Also, we can discuss results at next follow-up visit.   Please schedule a Follow-up Appointment to: Return in about 2 weeks (around 01/07/2019) for annual physical.  If you have  any other questions or concerns, please feel free to call the office or send a message through [place]. You may also schedule an earlier appointment if necessary.  Additionally, you may be receiving a survey about your experience at our office within a few days to 1 week by e-mail or mail. We value your feedback.  Nobie Putnam, DO [place]

## 2018-12-25 ENCOUNTER — Other Ambulatory Visit: Payer: Self-pay | Admitting: Family Medicine

## 2018-12-25 DIAGNOSIS — J4540 Moderate persistent asthma, uncomplicated: Secondary | ICD-10-CM

## 2019-01-07 ENCOUNTER — Other Ambulatory Visit: Payer: Managed Care, Other (non HMO)

## 2019-01-10 ENCOUNTER — Other Ambulatory Visit: Payer: Managed Care, Other (non HMO)

## 2019-01-14 ENCOUNTER — Encounter: Payer: Managed Care, Other (non HMO) | Admitting: Family Medicine

## 2019-02-11 ENCOUNTER — Other Ambulatory Visit: Payer: Self-pay

## 2019-02-11 ENCOUNTER — Ambulatory Visit (INDEPENDENT_AMBULATORY_CARE_PROVIDER_SITE_OTHER): Payer: Managed Care, Other (non HMO) | Admitting: Family Medicine

## 2019-02-11 ENCOUNTER — Encounter: Payer: Self-pay | Admitting: Family Medicine

## 2019-02-11 VITALS — BP 136/85 | HR 87 | Temp 98.6°F | Resp 16 | Ht 74.0 in | Wt 217.0 lb

## 2019-02-11 DIAGNOSIS — E782 Mixed hyperlipidemia: Secondary | ICD-10-CM | POA: Diagnosis not present

## 2019-02-11 DIAGNOSIS — F410 Panic disorder [episodic paroxysmal anxiety] without agoraphobia: Secondary | ICD-10-CM

## 2019-02-11 DIAGNOSIS — E663 Overweight: Secondary | ICD-10-CM

## 2019-02-11 DIAGNOSIS — R7309 Other abnormal glucose: Secondary | ICD-10-CM

## 2019-02-11 DIAGNOSIS — F411 Generalized anxiety disorder: Secondary | ICD-10-CM | POA: Diagnosis not present

## 2019-02-11 DIAGNOSIS — J4540 Moderate persistent asthma, uncomplicated: Secondary | ICD-10-CM

## 2019-02-11 DIAGNOSIS — Z0000 Encounter for general adult medical examination without abnormal findings: Secondary | ICD-10-CM | POA: Diagnosis not present

## 2019-02-11 MED ORDER — ESCITALOPRAM OXALATE 10 MG PO TABS: 10.0000 mg | ORAL_TABLET | Freq: Every day | ORAL | 1 refills | Status: DC

## 2019-02-11 NOTE — Assessment & Plan Note (Addendum)
Mild elevated LDL >150 Concern fam history heart disease early, in father age 34 Last lipid panel 2019  Plan: 1. Due for fasting lipid, next week - review result, anticipated still focus on lifestyle primarily unless LDL >170+ then can consider medication, in future discussed prevention for cardiovascular and when to seek screening with further work up  Future consider adv lipid panel w/ inflammation testing

## 2019-02-11 NOTE — Assessment & Plan Note (Signed)
Remains significantly improved overall GAD, still on intermittent SSRI escitalopram rarely use BDZ Lorazepam History of rare panic attack No depression - No prior dx / Psych / counseling  Plan: 1. Reorder escitalopram now 10mg  dosing for intermittent use - May use Lorapzeam existing rx remaining few pills PRN only - can refill as temporary as long as very limited #10 pills or less, goal to remain off in future - Future may benefit from therapist if worse anxiety

## 2019-02-11 NOTE — Assessment & Plan Note (Signed)
Controlled Flare up at work due to mask - note written for mask exemption due to asthma / work conditions

## 2019-02-11 NOTE — Assessment & Plan Note (Signed)
Check A1c Last 5.6 Encourage lifestyle

## 2019-02-11 NOTE — Progress Notes (Signed)
Subjective:    Patient ID: Jesse Blanchard, male    DOB: 09-17-1985, 33 y.o.   MRN: [id number]  Jesse Blanchard is a 33 y.o. male presenting on 02/11/2019 for Annual Exam   HPI   Here for Annual Physical and Due for lab results.  Wellness / Lifestyle He is doing well overall Weight down 10-15 lbs in summer with physical activity  HYPERLIPIDEMIA: - Reports no concerns. Last lipid panel 2019, elevated LDL >150 See fam history heart disease below  Follow-up Anxiety/GAD w/ panic He continues on intermittent escitalopram dosing, taking half of 20 for  for 1-2 week PRN when needed. With good results, has some mild breakthrough anxiety that often resolves later in the day. He does not prefer to take daily med.  Health Maintenance: Due for Flu Shot, declines today despite counseling on benefits  Reviewed early cancer screening guidelines - based on fam history, he is not at early risk of prostate cancer, will start screen PSA at age 77-50. For colon cancer will initiate at age 3+ when ready.  Regarding fam history of heart disease at young age, early MI in father at age 1 - I advised him that we can offer future adv lipid panel if interested in mid to late 30s, otherwise, if any symptoms at all we can refer to Cardiology for further work-up with stress test among other tests if indicated. He remains asymptomatic at this time.  Depression screen [facility] 2/9 02/11/2019 12/24/2018 11/25/2017  Decreased Interest 0 0 0  Down, Depressed, Hopeless 0 0 0  PHQ - 2 Score 0 0 0   GAD 7 : Generalized Anxiety Score 02/11/2019 05/28/2018 10/13/2017 09/26/2016  Nervous, Anxious, on Edge 2 0 1 3  Control/stop worrying 1 0 1 2  Worry too much - different things Trouble relaxing Restless 2 1 0 2  Easily annoyed or irritable 2 2 0 0  Afraid - awful might happen 1 0 1 2  Total GAD 7 Score Anxiety Difficulty Not difficult at all Somewhat difficult Not difficult at all Not  difficult at all     Past Medical History:  Diagnosis Date  . Asthma    Past Surgical History:  Procedure Laterality Date  . NO PAST SURGERIES     Social History   Socioeconomic History  . Marital status: Married    Spouse name: Not on file  . Number of children: Not on file  . Years of education: [employer]  . Highest education level: Not on file  Occupational History  . 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 Financial resource strain: Not on file  . Food insecurity    Worry: Not on file    Inability: Not on file  . Transportation needs    Medical: Not on file    Non-medical: Not on file  Tobacco Use  . Smoking status: Never Smoker  . Smokeless tobacco: Current User    Types: Chew  . Tobacco comment: 1 can chewing tobacco daily, 15 years  Substance and Sexual Activity  . Alcohol use: Yes    Comment: occasionally weekends  . Drug use: No  . Sexual activity: Not on file  Lifestyle  . Physical activity    Days per week: Not on file    Minutes per session: Not on file  . Stress: Not on file  Relationships  . Social Herbalist on phone: Not on file    Gets together: Not on file    Attends religious service: Not on file    Active member of club or organization: Not on file    Attends meetings of clubs or organizations: Not on file    Relationship status: Not on file  . Intimate partner violence    Fear of current or ex partner: Not on file    Emotionally abused: Not on file    Physically abused: Not on file    Forced sexual activity: Not on file  Other Topics Concern  . Not on file  Social History Narrative  . Not on file   Family History  Problem Relation Age of Onset  . Diabetes Mother   . Heart disease Father   . Diabetes Father   . Heart attack Father 51       S/p CABG  . Heart disease Paternal Grandfather   . Heart attack Paternal Grandfather 26       repeat age 79 fatal  . Diabetes  Paternal Grandfather   . Diabetes Sister   . Hypertension Sister   . Heart attack Maternal Grandmother 60  . Cancer Maternal Grandfather        Undetermined, possible GI malignancy  . Diabetes Paternal Uncle   . Prostate cancer Neg Hx    Current Outpatient Medications on File Prior to Visit  Medication Sig  . fluticasone (FLONASE) 50 MCG/ACT nasal spray Place 2 sprays into both nostrils daily. Use for 4-6 weeks then stop and use seasonally or as needed.  . montelukast (SINGULAIR) 10 MG tablet TAKE 1 TABLET BY MOUTH EVERYDAY AT BEDTIME  . PROAIR HFA 108 (90 Base) MCG/ACT inhaler INHALE 2 PUFFS INTO THE LUNGS AS DIRECTED  . SYMBICORT 160-4.5 MCG/ACT inhaler TAKE 2 PUFFS BY MOUTH TWICE A DAY  . Pseudoeph-Doxylamine-DM-APAP (NYQUIL PO) Take by mouth. Taking as needed   No current facility-administered medications on file prior to visit.     Review of Systems  Constitutional: Negative for activity change, appetite change, chills, diaphoresis, fatigue and fever.  HENT: Negative for congestion and hearing loss.   Eyes: Negative for visual disturbance.  Respiratory: Negative for apnea, cough, choking, chest tightness, shortness of breath and wheezing.   Cardiovascular: Negative for chest pain, palpitations and leg swelling.  Gastrointestinal: Negative for abdominal pain, anal bleeding, blood in stool, constipation, diarrhea, nausea and vomiting.  Endocrine: Negative for cold intolerance.  Genitourinary: Negative for difficulty urinating, dysuria, frequency and hematuria.  Musculoskeletal: Negative for arthralgias, back pain and neck pain.  Skin: Negative for rash.  Allergic/Immunologic: Negative for environmental allergies.  Neurological: Negative for dizziness, weakness, light-headedness, numbness and headaches.  Hematological: Negative for adenopathy.  Psychiatric/Behavioral: Negative for behavioral problems, dysphoric mood and sleep disturbance. The patient is nervous/anxious (not  currently).    Per HPI unless specifically indicated above      Objective:    BP 136/85   Pulse 87   Temp 98.6 F (37 C) (Oral)   Resp 16   Ht 6\' 2"  (1.88 m)   Wt 217 lb (98.4 kg)   BMI 27.86 kg/m   Wt Readings from Last 3 Encounters:  02/11/19 217 lb (98.4 kg)  06/14/18 221 lb 8 oz (100.5 kg)  05/28/18 225 lb (102.1 kg)    Physical Exam Vitals signs and nursing note reviewed.  Constitutional:      General: He is not in  acute distress.    Appearance: He is well-developed. He is not diaphoretic.     Comments: Well-appearing, comfortable, cooperative  HENT:     Head: Normocephalic and atraumatic.  Eyes:     General:        Right eye: No discharge.        Left eye: No discharge.     Conjunctiva/sclera: Conjunctivae normal.     Pupils: Pupils are equal, round, and reactive to light.  Neck:     Musculoskeletal: Normal range of motion and neck supple.     Thyroid: No thyromegaly.     Comments: No carotid bruits Cardiovascular:     Rate and Rhythm: Normal rate and regular rhythm.     Heart sounds: Normal heart sounds. No murmur.  Pulmonary:     Effort: Pulmonary effort is normal. No respiratory distress.     Breath sounds: Normal breath sounds. No wheezing or rales.  Abdominal:     General: Bowel sounds are normal. There is no distension.     Palpations: Abdomen is soft. There is no mass.     Tenderness: There is no abdominal tenderness.  Musculoskeletal: Normal range of motion.        General: No tenderness.     Comments: Upper / Lower Extremities: - Normal muscle tone, strength bilateral upper extremities 5/5, lower extremities 5/5  Lymphadenopathy:     Cervical: No cervical adenopathy.  Skin:    General: Skin is warm and dry.     Findings: No erythema or rash.  Neurological:     Mental Status: He is alert and oriented to person, place, and time.     Comments: Distal sensation intact to light touch all extremities  Psychiatric:        Behavior: Behavior  normal.     Comments: Well groomed, good eye contact, normal speech and thoughts, not anxious appearing     Recent Labs    05/28/18 1836  HGBA1C 5.6        Assessment & Plan:   Problem List Items Addressed This Visit    Mixed hyperlipidemia    Mild elevated LDL >150 Concern fam history heart disease early, in father age 34 Last lipid panel 2019  Plan: 1. Due for fasting lipid, next week - review result, anticipated still focus on lifestyle primarily unless LDL >170+ then can consider medication, in future discussed prevention for cardiovascular and when to seek screening with further work up  Future consider adv lipid panel w/ inflammation testing      Relevant Orders   CBC with Differential/Platelet   COMPLETE METABOLIC PANEL WITH GFR   Lipid panel   Generalized anxiety disorder with panic attacks    Remains significantly improved overall GAD, still on intermittent SSRI escitalopram rarely use BDZ Lorazepam History of rare panic attack No depression - No prior dx / Psych / counseling  Plan: 1. Reorder escitalopram now 10mg  dosing for intermittent use - May use Lorapzeam existing rx remaining few pills PRN only - can refill as temporary as long as very limited #10 pills or less, goal to remain off in future - Future may benefit from therapist if worse anxiety      Relevant Medications   escitalopram (LEXAPRO) 10 MG tablet   Other Relevant Orders   COMPLETE METABOLIC PANEL WITH GFR   Elevated hemoglobin A1c    Check A1c Last 5.6 Encourage lifestyle      Relevant Orders   Hemoglobin A1c   Asthma,  moderate persistent    Controlled Flare up at work due to mask - note written for mask exemption due to asthma / work conditions      Relevant Orders   COMPLETE METABOLIC PANEL WITH GFR    Other Visit Diagnoses    Annual physical exam    -  Primary   Relevant Orders   Hemoglobin A1c   CBC with Differential/Platelet   COMPLETE METABOLIC PANEL WITH GFR   Lipid  panel   Overweight (BMI 25.0-29.9)          Updated Health Maintenance information Ordered labs - will review once results available and share on mychart Encouraged improvement to lifestyle with diet and exercise Maintain healthy weight  Meds ordered this encounter  Medications  . escitalopram (LEXAPRO) 10 MG tablet    Sig: Take 1 tablet (10 mg total) by mouth daily.    Dispense:  90 tablet    Refill:  1     Follow up plan: Return in about 6 months (around 08/12/2019) for 6 months follow-up - Anxiety / Asthma.   future ordered Labs in 1 week   Saralyn Pilar, DO [facility] 02/11/2019, 3:16 PM

## 2019-02-11 NOTE — Patient Instructions (Addendum)
Thank you for coming to the office today.  Check with insurance letter - should list an alternative - either generic Proair (albuterol) or Proventil or Ventolin are the other name brands, otherwise can use Goodrx coupon for generic for $26  DUE for FASTING BLOOD WORK (no food or drink after midnight before the lab appointment, only water or coffee without cream/sugar on the morning of)  SCHEDULE "Lab Only" visit in the morning at the clinic for lab draw in 1 week  - Make sure Lab Only appointment is at about 1 week before your next appointment, so that results will be available  For Lab Results, once available within 2-3 days of blood draw, you can can log in to MyChart online to view your results and a brief explanation. Also, we can discuss results at next follow-up visit.   Please schedule a Follow-up Appointment to: Return in about 6 months (around 08/12/2019) for 6 months follow-up - Anxiety / Asthma.  If you have any other questions or concerns, please feel free to call the office or send a message through [place]. You may also schedule an earlier appointment if necessary.  Additionally, you may be receiving a survey about your experience at our office within a few days to 1 week by e-mail or mail. We value your feedback.  Nobie Putnam, DO [place]

## 2019-02-18 ENCOUNTER — Other Ambulatory Visit: Payer: Managed Care, Other (non HMO)

## 2019-02-18 ENCOUNTER — Other Ambulatory Visit: Payer: Self-pay

## 2019-02-18 DIAGNOSIS — Z0000 Encounter for general adult medical examination without abnormal findings: Secondary | ICD-10-CM

## 2019-02-18 DIAGNOSIS — J4540 Moderate persistent asthma, uncomplicated: Secondary | ICD-10-CM

## 2019-02-18 DIAGNOSIS — R7309 Other abnormal glucose: Secondary | ICD-10-CM

## 2019-02-18 DIAGNOSIS — F410 Panic disorder [episodic paroxysmal anxiety] without agoraphobia: Secondary | ICD-10-CM

## 2019-02-18 DIAGNOSIS — E782 Mixed hyperlipidemia: Secondary | ICD-10-CM

## 2019-02-19 LAB — CBC WITH DIFFERENTIAL/PLATELET
Absolute Monocytes: 540 cells/uL (ref 200–950)
Basophils Absolute: 113 cells/uL (ref 0–200)
Basophils Relative: 1.5 %
Eosinophils Absolute: 563 cells/uL — ABNORMAL HIGH (ref 15–500)
Eosinophils Relative: 7.5 %
HCT: 44.3 % (ref 38.5–50.0)
Hemoglobin: 15.1 g/dL (ref 13.2–17.1)
Lymphs Abs: 3023 cells/uL (ref 850–3900)
MCH: 31 pg (ref 27.0–33.0)
MCHC: 34.1 g/dL (ref 32.0–36.0)
MCV: 91 fL (ref 80.0–100.0)
MPV: 10.8 fL (ref 7.5–12.5)
Monocytes Relative: 7.2 %
Neutro Abs: 3263 cells/uL (ref 1500–7800)
Neutrophils Relative %: 43.5 %
Platelets: 239 10*3/uL (ref 140–400)
RBC: 4.87 10*6/uL (ref 4.20–5.80)
RDW: 12.4 % (ref 11.0–15.0)
Total Lymphocyte: 40.3 %
WBC: 7.5 10*3/uL (ref 3.8–10.8)

## 2019-02-19 LAB — LIPID PANEL
Cholesterol: 263 mg/dL — ABNORMAL HIGH (ref <200)
HDL: 40 mg/dL (ref >=40)
LDL Cholesterol (Calc): 189 mg/dL (calc) — ABNORMAL HIGH
Non-HDL Cholesterol (Calc): 223 mg/dL (calc) — ABNORMAL HIGH (ref <130)
Total CHOL/HDL Ratio: 6.6 (calc) — ABNORMAL HIGH (ref <5.0)
Triglycerides: 175 mg/dL — ABNORMAL HIGH (ref <150)

## 2019-02-19 LAB — COMPLETE METABOLIC PANEL WITH GFR
AG Ratio: 2 (calc) (ref 1.0–2.5)
ALT: 35 U/L (ref 9–46)
AST: 18 U/L (ref 10–40)
Albumin: 4.4 g/dL (ref 3.6–5.1)
Alkaline phosphatase (APISO): 40 U/L (ref 36–130)
BUN: 10 mg/dL (ref 7–25)
CO2: 29 mmol/L (ref 20–32)
Calcium: 9.6 mg/dL (ref 8.6–10.3)
Chloride: 106 mmol/L (ref 98–110)
Creat: 1.03 mg/dL (ref 0.60–1.35)
GFR, Est African American: 109 mL/min/{1.73_m2} (ref >=60)
GFR, Est Non African American: 94 mL/min/{1.73_m2} (ref >=60)
Globulin: 2.2 g/dL (calc) (ref 1.9–3.7)
Glucose, Bld: 104 mg/dL — ABNORMAL HIGH (ref 65–99)
Potassium: 4.5 mmol/L (ref 3.5–5.3)
Sodium: 141 mmol/L (ref 135–146)
Total Bilirubin: 0.5 mg/dL (ref 0.2–1.2)
Total Protein: 6.6 g/dL (ref 6.1–8.1)

## 2019-02-19 LAB — HEMOGLOBIN A1C
Hgb A1c MFr Bld: 5.4 % of total Hgb (ref <5.7)
Mean Plasma Glucose: 108 (calc)
eAG (mmol/L): 6 (calc)

## 2019-05-02 ENCOUNTER — Other Ambulatory Visit: Payer: Self-pay | Admitting: Family Medicine

## 2019-05-02 DIAGNOSIS — J4540 Moderate persistent asthma, uncomplicated: Secondary | ICD-10-CM

## 2019-05-19 DIAGNOSIS — J4540 Moderate persistent asthma, uncomplicated: Secondary | ICD-10-CM

## 2019-05-19 MED ORDER — ALBUTEROL SULFATE HFA 108 (90 BASE) MCG/ACT IN AERS: 2.0000 | INHALATION_SPRAY | RESPIRATORY_TRACT | 3 refills | Status: DC | PRN

## 2019-06-18 ENCOUNTER — Other Ambulatory Visit: Payer: Self-pay | Admitting: Family Medicine

## 2019-06-18 DIAGNOSIS — F410 Panic disorder [episodic paroxysmal anxiety] without agoraphobia: Secondary | ICD-10-CM

## 2019-06-18 DIAGNOSIS — F411 Generalized anxiety disorder: Secondary | ICD-10-CM

## 2019-08-08 ENCOUNTER — Ambulatory Visit (INDEPENDENT_AMBULATORY_CARE_PROVIDER_SITE_OTHER): Payer: Managed Care, Other (non HMO) | Admitting: Family Medicine

## 2019-08-08 ENCOUNTER — Encounter: Payer: Self-pay | Admitting: Family Medicine

## 2019-08-08 ENCOUNTER — Other Ambulatory Visit: Payer: Self-pay

## 2019-08-08 VITALS — BP 150/80 | HR 99 | Temp 98.4°F | Resp 16 | Ht 74.0 in | Wt 224.0 lb

## 2019-08-08 DIAGNOSIS — R29818 Other symptoms and signs involving the nervous system: Secondary | ICD-10-CM | POA: Diagnosis not present

## 2019-08-08 DIAGNOSIS — G4719 Other hypersomnia: Secondary | ICD-10-CM

## 2019-08-08 NOTE — Patient Instructions (Addendum)
Thank you for coming to the office today.  Referral to [facility] - stay tuned for apt and call for either home sleep or in center sleep test, will need a 2nd test most likely if first one is abnormal, then they can use CPAP machine to fix it, and diagnose / order supplies.  Likely cause of most if not all of your symptoms. And elevated BP  Can try oral appliance for breathing / reduce snoring and sleep apnea - these can be purchased online or by third party.   Please schedule a Follow-up Appointment to: Return in about 4 weeks (around 09/05/2019) for 4-6 weeks BP, suspected sleep apnea.  If you have any other questions or concerns, please feel free to call the office or send a message through MyChart. You may also schedule an earlier appointment if necessary.  Additionally, you may be receiving a survey about your experience at our office within a few days to 1 week by e-mail or mail. We value your feedback.  Saralyn Pilar, DO [facility], [place]

## 2019-08-08 NOTE — Progress Notes (Addendum)
Subjective:    Patient ID: Jesse Blanchard, male    DOB: 02-01-1986, 34 y.o.   MRN: [id number]  Jesse Blanchard is a 34 y.o. male presenting on 08/08/2019 for Hypertension (exhaustion, nausea onset week)   HPI   Suspected Obstructive Sleep Apnea / Excessive Daytime Sleepiness Chronic problem worsening now, with concern for sleep apnea. He reports history of snoring and waking up at night with apnea episodes and wife is concerned he has OSA, asking him to be evaluated. He has had high blood pressure readings worse lately see list (157/102, 150/90, 172/105, 163/87, 154/88) otherwise no previous diagnosis of High Blood Pressure. He has had worse symptoms of fatigue, daytime sleepiness dozing off, exhaustion, not feeling rested when wake up, he works on shift work 4pm to 2am and if he gets good rest then he feels good but rarely. Always tired, fatigued. Days are worse when he is fatigued and sleepy Wake up and will be holding breath or stopped breathing Has smart watch, waking up 10+ times a night, says not sleeping well Goal to switch to low sodium, balanced eating schedule   Epworth Sleepiness Scale Total Score: 13 Sitting and reading - 2 Watching TV - 3 Sitting inactive in a public place - 3 As a passenger in a car for an hour without a break - 0 Lying down to rest in the afternoon when circumstances permit - 3 Sitting and talking to someone - 0 Sitting quietly after a lunch without alcohol - 2 In a car, while stopped for a few minutes in traffic - 0  STOP-Bang OSA scoring Snoring yes   Tiredness yes   Observed apneas yes   Pressure HTN yes   BMI > 35 kg/m2 no   Age > 50  no   Neck (male >17 in; Male >38 in)  yes 52"  Gender male yes   OSA risk low (0-2)  OSA risk intermediate (3-4)  OSA risk high (5+)  Total: 6 High Risk     Depression screen [facility] 2/9 08/08/2019 02/11/2019 12/24/2018  Decreased Interest 1 0 0  Down, Depressed, Hopeless 0 0 0  PHQ - 2 Score 1 0 0   GAD 7 :  Generalized Anxiety Score 08/08/2019 02/11/2019 05/28/2018 10/13/2017  Nervous, Anxious, on Edge 2 2 0 1  Control/stop worrying 2 1 0 1  Worry too much - different things 2 3 3 1   Trouble relaxing 3 3 1 2   Restless 1 2 1  0  Easily annoyed or irritable 2 2 2  0  Afraid - awful might happen 1 1 0 1  Total GAD 7 Score 13 14 7 6   Anxiety Difficulty Somewhat difficult Not difficult at all Somewhat difficult Not difficult at all     Social History   Tobacco Use  . Smoking status: Never Smoker  . Smokeless tobacco: Current User    Types: Chew  . Tobacco comment: 1 can chewing tobacco daily, 15 years  Substance Use Topics  . Alcohol use: Yes    Comment: occasionally weekends  . Drug use: No    Review of Systems Per HPI unless specifically indicated above     Objective:    BP (!) 150/80   Pulse 99   Temp 98.4 F (36.9 C) (Temporal)   Resp 16   Ht 6\' 2"  (1.88 m)   Wt 224 lb (101.6 kg)   SpO2 96%   BMI 28.76 kg/m   Wt Readings from Last 3 Encounters:  08/08/19  224 lb (101.6 kg)  02/11/19 217 lb (98.4 kg)  06/14/18 221 lb 8 oz (100.5 kg)    Physical Exam Vitals and nursing note reviewed.  Constitutional:      General: He is not in acute distress.    Appearance: He is well-developed. He is not diaphoretic.     Comments: Well-appearing, comfortable, cooperative  HENT:     Head: Normocephalic and atraumatic.  Eyes:     General:        Right eye: No discharge.        Left eye: No discharge.     Conjunctiva/sclera: Conjunctivae normal.  Neck:     Thyroid: No thyromegaly.  Cardiovascular:     Rate and Rhythm: Normal rate and regular rhythm.     Heart sounds: Normal heart sounds. No murmur.  Pulmonary:     Effort: Pulmonary effort is normal. No respiratory distress.     Breath sounds: Normal breath sounds. No wheezing or rales.  Musculoskeletal:        General: Normal range of motion.     Cervical back: Normal range of motion and neck supple.  Lymphadenopathy:      Cervical: No cervical adenopathy.  Skin:    General: Skin is warm and dry.     Findings: No erythema or rash.  Neurological:     Mental Status: He is alert and oriented to person, place, and time.  Psychiatric:        Behavior: Behavior normal.     Comments: Well groomed, good eye contact, normal speech and thoughts      Results for orders placed or performed in visit on 02/18/19  Lipid panel  Result Value Ref Range   Cholesterol 263 (H) <200 mg/dL   HDL 40 > OR = 40 mg/dL   Triglycerides 275 (H) <150 mg/dL   LDL Cholesterol (Calc) 189 (H) mg/dL (calc)   Total CHOL/HDL Ratio 6.6 (H) <5.0 (calc)   Non-HDL Cholesterol (Calc) 223 (H) <130 mg/dL (calc)  COMPLETE METABOLIC PANEL WITH GFR  Result Value Ref Range   Glucose, Bld 104 (H) 65 - 99 mg/dL   BUN 10 7 - 25 mg/dL   Creat 1.70 0.17 - 4.94 mg/dL   GFR, Est Non African American 94 > OR = 60 mL/min/1.79m2   GFR, Est African American 109 > OR = 60 mL/min/1.50m2   BUN/Creatinine Ratio NOT APPLICABLE 6 - 22 (calc)   Sodium 141 135 - 146 mmol/L   Potassium 4.5 3.5 - 5.3 mmol/L   Chloride 106 98 - 110 mmol/L   CO2 29 20 - 32 mmol/L   Calcium 9.6 8.6 - 10.3 mg/dL   Total Protein 6.6 6.1 - 8.1 g/dL   Albumin 4.4 3.6 - 5.1 g/dL   Globulin 2.2 1.9 - 3.7 g/dL (calc)   AG Ratio 2.0 1.0 - 2.5 (calc)   Total Bilirubin 0.5 0.2 - 1.2 mg/dL   Alkaline phosphatase (APISO) 40 36 - 130 U/L   AST 18 10 - 40 U/L   ALT 35 9 - 46 U/L  CBC with Differential/Platelet  Result Value Ref Range   WBC 7.5 3.8 - 10.8 Thousand/uL   RBC 4.87 4.20 - 5.80 Million/uL   Hemoglobin 15.1 13.2 - 17.1 g/dL   HCT 49.6 75.9 - 16.3 %   MCV 91.0 80.0 - 100.0 fL   MCH 31.0 27.0 - 33.0 pg   MCHC 34.1 32.0 - 36.0 g/dL   RDW 84.6 65.9 - 93.5 %  Platelets 239 140 - 400 Thousand/uL   MPV 10.8 7.5 - 12.5 fL   Neutro Abs 3,263 1,500 - 7,800 cells/uL   Lymphs Abs 3,023 850 - 3,900 cells/uL   Absolute Monocytes 540 200 - 950 cells/uL   Eosinophils Absolute 563 (H)  15 - 500 cells/uL   Basophils Absolute 113 0 - 200 cells/uL   Neutrophils Relative % 43.5 %   Total Lymphocyte 40.3 %   Monocytes Relative 7.2 %   Eosinophils Relative 7.5 %   Basophils Relative 1.5 %  Hemoglobin A1c  Result Value Ref Range   Hgb A1c MFr Bld 5.4 <5.7 % of total Hgb   Mean Plasma Glucose 108 (calc)   eAG (mmol/L) 6.0 (calc)      Assessment & Plan:   Problem List Items Addressed This Visit    None    Visit Diagnoses    Suspected sleep apnea    -  Primary   Excessive daytime sleepiness          Now worsening symptoms, brought to our attention with persistent clinical concern for suspected obstructive sleep apnea given reported symptoms with witnessed apnea, snoring and sleep disturbance, fatigue excessive sleepiness. - Screening: ESS score 13 / STOP-Bang Score 6 high risk - Neck Circumference: 19" - Co-morbidities: Elevated BP, Anxiety  Plan: 1. Discussion on initial diagnosis and testing for OSA, risk factors, management, complications - Deferred new BP medication today, will defer diagnosis and treat OSA first - Improve lifestyle, limit other factors for HTN 2. Agree to proceed with sleep study testing based on clinical concerns referral to be faxed on 08/09/19 to [facility] they can work with patient on in home vs in center sleep study - Advised he can get OTC option for oral appliance in meantime while waiting to see if helps, but likely will not resolve his OSA problem if moderate to severe.   No orders of the defined types were placed in this encounter.    Follow up plan: Return in about 4 weeks (around 09/05/2019) for 4-6 weeks BP, suspected sleep apnea.   Saralyn Pilar, DO [facility] 08/08/2019, 4:34 PM

## 2019-12-14 ENCOUNTER — Other Ambulatory Visit: Payer: Self-pay

## 2019-12-14 ENCOUNTER — Other Ambulatory Visit: Payer: Self-pay | Admitting: Radiology

## 2019-12-14 DIAGNOSIS — Z20822 Contact with and (suspected) exposure to covid-19: Secondary | ICD-10-CM

## 2019-12-15 ENCOUNTER — Other Ambulatory Visit: Payer: Self-pay | Admitting: Family Medicine

## 2019-12-15 DIAGNOSIS — J4540 Moderate persistent asthma, uncomplicated: Secondary | ICD-10-CM

## 2019-12-15 NOTE — Telephone Encounter (Signed)
Requested Prescriptions  Pending Prescriptions Disp Refills   albuterol (VENTOLIN HFA) 108 (90 Base) MCG/ACT inhaler [Pharmacy Med Name: ALBUTEROL HFA (PROVENTIL) INH] 8 g 3    Sig: INHALE 2 PUFFS INTO THE LUNGS EVERY 4 HOURS AS NEEDED FOR WHEEZE OR FOR SHORTNESS OF BREATH     Pulmonology:  Beta Agonists Failed - 12/15/2019  1:14 AM      Failed - One inhaler should last at least one month. If the patient is requesting refills earlier, contact the patient to check for uncontrolled symptoms.      Passed - Valid encounter within last 12 months    Recent Outpatient Visits          4 months ago Suspected sleep apnea   [facility] Smitty Cords, DO   10 months ago Annual physical exam   [facility] Smitty Cords, DO   11 months ago Generalized anxiety disorder with panic attacks   [facility] Smitty Cords, DO   1 year ago Influenza   [facility] [place], Netta Neat, DO   1 year ago Elevated hemoglobin A1c   [facility] [place], Netta Neat, [place]

## 2019-12-16 ENCOUNTER — Encounter: Payer: Self-pay | Admitting: Family Medicine

## 2019-12-16 ENCOUNTER — Telehealth: Payer: Self-pay | Admitting: *Deleted

## 2019-12-16 LAB — SARS-COV-2, NAA 2 DAY TAT

## 2019-12-16 LAB — NOVEL CORONAVIRUS, NAA: SARS-CoV-2, NAA: DETECTED — AB

## 2019-12-16 NOTE — Telephone Encounter (Signed)
Called patient, reviewed his positive result. I wrote him the work note, per health dept out quarantine 8/25 thru 9/4, return 9/5 if fever free.  Copied his test result on the note. He received it already on mychart.  He was advised schedule virtual visit with [place] here if he has worsening or new concerns, or can seek care emergently at hospital/UC if severe symptoms.  Saralyn Pilar, DO [facility] 12/16/2019, 6:30 PM

## 2019-12-16 NOTE — Telephone Encounter (Signed)
Patient has been notified of COVID test results- he is calling for copy of result and letter for work. Call to office- they can do this for him. They will contact patient when they have documents ready. (Patient chart is set for merge- results CB#[id number])

## 2020-05-05 ENCOUNTER — Other Ambulatory Visit: Payer: Self-pay

## 2020-05-05 ENCOUNTER — Other Ambulatory Visit: Payer: No Typology Code available for payment source

## 2020-05-05 DIAGNOSIS — Z20822 Contact with and (suspected) exposure to covid-19: Secondary | ICD-10-CM

## 2020-05-05 DIAGNOSIS — J4540 Moderate persistent asthma, uncomplicated: Secondary | ICD-10-CM

## 2020-05-08 MED ORDER — SYMBICORT 160-4.5 MCG/ACT IN AERO: INHALATION_SPRAY | RESPIRATORY_TRACT | 0 refills | Status: DC

## 2020-05-09 LAB — NOVEL CORONAVIRUS, NAA: SARS-CoV-2, NAA: DETECTED — AB

## 2020-05-10 ENCOUNTER — Telehealth: Payer: Self-pay | Admitting: *Deleted

## 2020-05-10 NOTE — Telephone Encounter (Signed)
Called to discuss with patient about COVID-19 symptoms and the use of one of the available treatments for those with mild to moderate Covid symptoms and at a high risk of hospitalization.  Pt appears to qualify for outpatient treatment due to co-morbid conditions and/or a member of an at-risk group in accordance with the FDA Emergency Use Authorization.    Symptom onset:  Vaccinated:  Booster?  Immunocompromised?  Qualifiers: Asthma  Unable to reach pt - Left VM to return call for information.  Karsten Fells

## 2020-07-11 ENCOUNTER — Other Ambulatory Visit: Payer: Self-pay | Admitting: Family Medicine

## 2020-07-11 DIAGNOSIS — J4540 Moderate persistent asthma, uncomplicated: Secondary | ICD-10-CM

## 2020-07-16 ENCOUNTER — Other Ambulatory Visit: Payer: Self-pay | Admitting: Family Medicine

## 2020-07-16 DIAGNOSIS — J4540 Moderate persistent asthma, uncomplicated: Secondary | ICD-10-CM

## 2020-07-27 ENCOUNTER — Other Ambulatory Visit: Payer: Self-pay

## 2020-07-27 ENCOUNTER — Other Ambulatory Visit: Payer: Self-pay | Admitting: Family Medicine

## 2020-07-27 ENCOUNTER — Ambulatory Visit (INDEPENDENT_AMBULATORY_CARE_PROVIDER_SITE_OTHER): Payer: PRIVATE HEALTH INSURANCE | Admitting: Family Medicine

## 2020-07-27 ENCOUNTER — Encounter: Payer: Self-pay | Admitting: Family Medicine

## 2020-07-27 VITALS — BP 136/83 | HR 76 | Ht 74.0 in | Wt 218.6 lb

## 2020-07-27 DIAGNOSIS — Z0000 Encounter for general adult medical examination without abnormal findings: Secondary | ICD-10-CM

## 2020-07-27 DIAGNOSIS — F411 Generalized anxiety disorder: Secondary | ICD-10-CM | POA: Diagnosis not present

## 2020-07-27 DIAGNOSIS — F410 Panic disorder [episodic paroxysmal anxiety] without agoraphobia: Secondary | ICD-10-CM

## 2020-07-27 DIAGNOSIS — R7309 Other abnormal glucose: Secondary | ICD-10-CM

## 2020-07-27 DIAGNOSIS — E663 Overweight: Secondary | ICD-10-CM

## 2020-07-27 DIAGNOSIS — J4540 Moderate persistent asthma, uncomplicated: Secondary | ICD-10-CM

## 2020-07-27 DIAGNOSIS — E782 Mixed hyperlipidemia: Secondary | ICD-10-CM

## 2020-07-27 DIAGNOSIS — Z1159 Encounter for screening for other viral diseases: Secondary | ICD-10-CM

## 2020-07-27 MED ORDER — LORAZEPAM 0.5 MG PO TABS: 0.5000 mg | ORAL_TABLET | Freq: Every day | ORAL | 1 refills | Status: DC | PRN

## 2020-07-27 MED ORDER — PROAIR HFA 108 (90 BASE) MCG/ACT IN AERS: INHALATION_SPRAY | RESPIRATORY_TRACT | 5 refills | Status: DC

## 2020-07-27 MED ORDER — ESCITALOPRAM OXALATE 10 MG PO TABS: 1.0000 | ORAL_TABLET | Freq: Every day | ORAL | 3 refills | Status: DC

## 2020-07-27 MED ORDER — MONTELUKAST SODIUM 10 MG PO TABS
10.0000 mg | ORAL_TABLET | Freq: Every day | ORAL | 3 refills | Status: DC
Start: 2020-07-27 — End: 2021-07-30

## 2020-07-27 NOTE — Progress Notes (Signed)
Subjective:    Patient ID: Jesse Blanchard, male    DOB: 08-May-1985, 35 y.o.   MRN: [id number]  Geoffry Bannister is a 35 y.o. male presenting on 07/27/2020 for Asthma   HPI    Asthma, moderate persistent Seasonal Allergies, Environmental Need an alternative med to Symbicort, it is successful and working well however. Using it regularly, now only needing albuterol rescue inhaler maybe once at bedtime or not even. Worse with allergy pollen season. - No recent flare up, hospitalization or night-time awakening - Due for refills today - Denies wheezing dyspnea or cough   Follow-up GAD with history of panic Prior problem in past we have discussed, he has done well on Escitalopram. Needs refill Now he has developed more issues with fear and possible PTSD issues with bridges and heights. - He has had issues with some recurring memories that can bother him and cause worse anxiety.  Also he has few pills left on Lorazepam still, very rarely taking it at all. No longer having panic attacks. - Denies recent depression mood or panic   Depression screen [facility] 2/9 07/27/2020 08/08/2019 02/11/2019  Decreased Interest 0 1 0  Down, Depressed, Hopeless 0 0 0  PHQ - 2 Score 0 1 0   GAD 7 : Generalized Anxiety Score 07/27/2020 08/08/2019 02/11/2019 05/28/2018  Nervous, Anxious, on Edge 2 2 2  0  Control/stop worrying 3 2 1  0  Worry too much - different things 2 2 3 3   Trouble relaxing 3 3 3 1   Restless 1 1 2 1   Easily annoyed or irritable 1 2 2 2   Afraid - awful might happen 1 1 1  0  Total GAD 7 Score 13 13 14 7   Anxiety Difficulty Not difficult at all Somewhat difficult Not difficult at all Somewhat difficult      Social History   Tobacco Use  . Smoking status: Never Smoker  . Smokeless tobacco: Current User    Types: Chew  . Tobacco comment: 1 can chewing tobacco daily, 15 years  Vaping Use  . Vaping Use: Never used  Substance Use Topics  . Alcohol use: Yes    Comment: occasionally weekends   . Drug use: No    Review of Systems Per HPI unless specifically indicated above     Objective:    BP 136/83   Pulse 76   Ht 6\' 2"  (1.88 m)   Wt 218 lb 9.6 oz (99.2 kg)   SpO2 99%   BMI 28.07 kg/m   Wt Readings from Last 3 Encounters:  07/27/20 218 lb 9.6 oz (99.2 kg)  08/08/19 224 lb (101.6 kg)  02/11/19 217 lb (98.4 kg)    Physical Exam Vitals and nursing note reviewed.  Constitutional:      General: He is not in acute distress.    Appearance: He is well-developed. He is not diaphoretic.     Comments: Well-appearing, comfortable, cooperative  HENT:     Head: Normocephalic and atraumatic.  Eyes:     General:        Right eye: No discharge.        Left eye: No discharge.     Conjunctiva/sclera: Conjunctivae normal.  Neck:     Thyroid: No thyromegaly.  Cardiovascular:     Rate and Rhythm: Normal rate and regular rhythm.     Heart sounds: Normal heart sounds. No murmur heard.   Pulmonary:     Effort: Pulmonary effort is normal. No respiratory distress.  Breath sounds: Normal breath sounds. No wheezing or rales.  Musculoskeletal:        General: Normal range of motion.     Cervical back: Normal range of motion and neck supple.  Lymphadenopathy:     Cervical: No cervical adenopathy.  Skin:    General: Skin is warm and dry.     Findings: No erythema or rash.  Neurological:     Mental Status: He is alert and oriented to person, place, and time.  Psychiatric:        Behavior: Behavior normal.     Comments: Well groomed, good eye contact, normal speech and thoughts       Results for orders placed or performed in visit on 05/05/20  Novel Coronavirus, NAA ([facility])   Specimen: Nasopharyngeal(NP) swabs in vial transport medium   Nasopharynge  Result Value Ref Range   SARS-CoV-2, NAA Detected (A) Not Detected      Assessment & Plan:   Problem List Items Addressed This Visit    Moderate persistent asthma without complication - Primary   Relevant  Medications   montelukast (SINGULAIR) 10 MG tablet   PROAIR HFA 108 (90 Base) MCG/ACT inhaler   Generalized anxiety disorder with panic attacks   Relevant Medications   escitalopram (LEXAPRO) 10 MG tablet   LORazepam (ATIVAN) 0.5 MG tablet      #Asthma, moderate persistent No acute exacerbation now but less controlled off maintenance therapy Check with insurance on which preferred alternative inhaler - let me know which one they prefer.  - generic Symbicort - Dulera - Breo  Refill albuterol Refill Singulair  #GAD w/ panic attacks Mostly Controlled on Escitalopram, refill 10mg  daily Previously has had very low pill count for >1 year of Lorazepam 10 pills or less as back up, will re order now since expired. Use only PRN acute panic.  Meds ordered this encounter  Medications  . montelukast (SINGULAIR) 10 MG tablet    Sig: Take 1 tablet (10 mg total) by mouth at bedtime.    Dispense:  90 tablet    Refill:  3  . escitalopram (LEXAPRO) 10 MG tablet    Sig: Take 1 tablet (10 mg total) by mouth daily.    Dispense:  90 tablet    Refill:  3  . LORazepam (ATIVAN) 0.5 MG tablet    Sig: Take 1 tablet (0.5 mg total) by mouth daily as needed for anxiety.    Dispense:  10 tablet    Refill:  1  . PROAIR HFA 108 (90 Base) MCG/ACT inhaler    Sig: INHALE 2 PUFFS INTO THE LUNGS AS DIRECTED    Dispense:  8.5 each    Refill:  5      Follow up plan: Return in about 6 weeks (around 09/10/2020) for 7 weeks approx for fasting lab only then 1 week later Annual Physical.  Future labs ordered for 09/14/20   09/16/20, DO [facility] 07/27/2020, 9:50 AM

## 2020-07-27 NOTE — Patient Instructions (Addendum)
Thank you for coming to the office today.  Check into ENT specialist for sinus evaluation in future, let me know if ready for referral.  [facility] [address]  [place], [place] [address] Ph: [phone]  [facility] [address]  [place], [place] [address] Ph: [phone]  ----------------------------  Check with insurance on which preferred alternative inhaler - let me know which one they prefer.  - generic Symbicort - Elwin Sleight - Breo  DUE for FASTING BLOOD WORK (no food or drink after midnight before the lab appointment, only water or coffee without cream/sugar on the morning of)  SCHEDULE "Lab Only" visit in the morning at the clinic for lab draw in 2 MONTHS   - Make sure Lab Only appointment is at about 1 week before your next appointment, so that results will be available  For Lab Results, once available within 2-3 days of blood draw, you can can log in to MyChart online to view your results and a brief explanation. Also, we can discuss results at next follow-up visit.   Please schedule a Follow-up Appointment to: Return in about 6 weeks (around 09/10/2020) for 7 weeks approx for fasting lab only then 1 week later Annual Physical.  If you have any other questions or concerns, please feel free to call the office or send a message through MyChart. You may also schedule an earlier appointment if necessary.  Additionally, you may be receiving a survey about your experience at our office within a few days to 1 week by e-mail or mail. We value your feedback.  Saralyn Pilar, DO [facility] [place], [place]

## 2020-07-27 NOTE — Telephone Encounter (Signed)
   Notes to clinic: Script Clarification:NEED MORE DIRECTIONS LIKE HOW OFTEN   Requested Prescriptions  Pending Prescriptions Disp Refills   PROAIR HFA 108 (90 Base) MCG/ACT inhaler [Pharmacy Med Name: PROAIR HFA 90 MCG INHALER] 8.5 each 5    Sig: INHALE 2 PUFFS INTO THE LUNGS AS DIRECTED      Pulmonology:  Beta Agonists Failed - 07/27/2020 10:16 AM      Failed - One inhaler should last at least one month. If the patient is requesting refills earlier, contact the patient to check for uncontrolled symptoms.      Passed - Valid encounter within last 12 months    Recent Outpatient Visits           Today Moderate persistent asthma without complication   [facility] Smitty Cords, DO   11 months ago Suspected sleep apnea   [facility] Althea Charon, Netta Neat, DO   1 year ago Annual physical exam   [facility] Smitty Cords, DO   1 year ago Generalized anxiety disorder with panic attacks   [facility] Smitty Cords, DO   2 years ago Influenza   [facility] Althea Charon, Netta Neat, DO       Future Appointments             In 1 month Althea Charon, Netta Neat, DO [facility]

## 2020-07-30 MED ORDER — ALBUTEROL SULFATE HFA 108 (90 BASE) MCG/ACT IN AERS: 2.0000 | INHALATION_SPRAY | RESPIRATORY_TRACT | 5 refills | Status: DC | PRN

## 2020-07-30 MED ORDER — BREO ELLIPTA 200-25 MCG/INH IN AEPB: 1.0000 | INHALATION_SPRAY | Freq: Every day | RESPIRATORY_TRACT | 5 refills | Status: DC

## 2020-09-07 ENCOUNTER — Other Ambulatory Visit: Payer: PRIVATE HEALTH INSURANCE

## 2020-09-14 ENCOUNTER — Other Ambulatory Visit: Payer: PRIVATE HEALTH INSURANCE

## 2020-09-18 LAB — LIPID PANEL
Cholesterol: 234 mg/dL — ABNORMAL HIGH (ref <200)
HDL: 40 mg/dL (ref >=40)
LDL Cholesterol (Calc): 157 mg/dL (calc) — ABNORMAL HIGH
Non-HDL Cholesterol (Calc): 194 mg/dL (calc) — ABNORMAL HIGH (ref <130)
Total CHOL/HDL Ratio: 5.9 (calc) — ABNORMAL HIGH (ref <5.0)
Triglycerides: 208 mg/dL — ABNORMAL HIGH (ref <150)

## 2020-09-18 LAB — CBC WITH DIFFERENTIAL/PLATELET
Absolute Monocytes: 455 cells/uL (ref 200–950)
Basophils Absolute: 111 cells/uL (ref 0–200)
Basophils Relative: 1.7 %
Eosinophils Absolute: 572 cells/uL — ABNORMAL HIGH (ref 15–500)
Eosinophils Relative: 8.8 %
HCT: 47.2 % (ref 38.5–50.0)
Hemoglobin: 15.5 g/dL (ref 13.2–17.1)
Lymphs Abs: 2308 cells/uL (ref 850–3900)
MCH: 30.5 pg (ref 27.0–33.0)
MCHC: 32.8 g/dL (ref 32.0–36.0)
MCV: 92.9 fL (ref 80.0–100.0)
MPV: 10.8 fL (ref 7.5–12.5)
Monocytes Relative: 7 %
Neutro Abs: 3055 cells/uL (ref 1500–7800)
Neutrophils Relative %: 47 %
Platelets: 249 10*3/uL (ref 140–400)
RBC: 5.08 10*6/uL (ref 4.20–5.80)
RDW: 12 % (ref 11.0–15.0)
Total Lymphocyte: 35.5 %
WBC: 6.5 10*3/uL (ref 3.8–10.8)

## 2020-09-18 LAB — COMPLETE METABOLIC PANEL WITH GFR
AG Ratio: 2.1 (calc) (ref 1.0–2.5)
ALT: 29 U/L (ref 9–46)
AST: 16 U/L (ref 10–40)
Albumin: 4.4 g/dL (ref 3.6–5.1)
Alkaline phosphatase (APISO): 42 U/L (ref 36–130)
BUN: 13 mg/dL (ref 7–25)
CO2: 29 mmol/L (ref 20–32)
Calcium: 9.3 mg/dL (ref 8.6–10.3)
Chloride: 105 mmol/L (ref 98–110)
Creat: 1.06 mg/dL (ref 0.60–1.35)
GFR, Est African American: 105 mL/min/{1.73_m2} (ref >=60)
GFR, Est Non African American: 90 mL/min/{1.73_m2} (ref >=60)
Globulin: 2.1 g/dL (calc) (ref 1.9–3.7)
Glucose, Bld: 95 mg/dL (ref 65–99)
Potassium: 4 mmol/L (ref 3.5–5.3)
Sodium: 141 mmol/L (ref 135–146)
Total Bilirubin: 0.8 mg/dL (ref 0.2–1.2)
Total Protein: 6.5 g/dL (ref 6.1–8.1)

## 2020-09-18 LAB — HEPATITIS C ANTIBODY
Hepatitis C Ab: NONREACTIVE
SIGNAL TO CUT-OFF: 0 (ref <1.00)

## 2020-09-18 LAB — TSH: TSH: 1.78 mIU/L (ref 0.40–4.50)

## 2020-09-18 LAB — HEMOGLOBIN A1C
Hgb A1c MFr Bld: 5.5 % of total Hgb (ref <5.7)
Mean Plasma Glucose: 111 mg/dL
eAG (mmol/L): 6.2 mmol/L

## 2020-09-21 ENCOUNTER — Ambulatory Visit (INDEPENDENT_AMBULATORY_CARE_PROVIDER_SITE_OTHER): Payer: PRIVATE HEALTH INSURANCE | Admitting: Family Medicine

## 2020-09-21 ENCOUNTER — Other Ambulatory Visit: Payer: Self-pay | Admitting: Family Medicine

## 2020-09-21 ENCOUNTER — Encounter: Payer: Self-pay | Admitting: Family Medicine

## 2020-09-21 ENCOUNTER — Other Ambulatory Visit: Payer: Self-pay

## 2020-09-21 VITALS — BP 123/77 | HR 65 | Ht 74.0 in | Wt 212.2 lb

## 2020-09-21 DIAGNOSIS — R7309 Other abnormal glucose: Secondary | ICD-10-CM | POA: Diagnosis not present

## 2020-09-21 DIAGNOSIS — F411 Generalized anxiety disorder: Secondary | ICD-10-CM | POA: Diagnosis not present

## 2020-09-21 DIAGNOSIS — Z0000 Encounter for general adult medical examination without abnormal findings: Secondary | ICD-10-CM

## 2020-09-21 DIAGNOSIS — Z6827 Body mass index (BMI) 27.0-27.9, adult: Secondary | ICD-10-CM

## 2020-09-21 DIAGNOSIS — J4540 Moderate persistent asthma, uncomplicated: Secondary | ICD-10-CM

## 2020-09-21 DIAGNOSIS — E782 Mixed hyperlipidemia: Secondary | ICD-10-CM

## 2020-09-21 DIAGNOSIS — F410 Panic disorder [episodic paroxysmal anxiety] without agoraphobia: Secondary | ICD-10-CM

## 2020-09-21 NOTE — Assessment & Plan Note (Signed)
Controlled currently on Breo Continues Singulair Follow-up as need

## 2020-09-21 NOTE — Assessment & Plan Note (Signed)
Improved A1c now to 5.5, below range of PreDM Encourage continue improved lifestyle

## 2020-09-21 NOTE — Patient Instructions (Addendum)
Thank you for coming to the office today.  Lab results overall very good.  Mild still elevated LDL cholesterol 150 range, goal is 100-130. This is improved from last time.  For heart history we would recommend age 36+ evaluation, can refer to Cardiology around that time.  For colon cancer history - we could consider age 59-45 screening, if you learn the ages of the family member we may be able to get insurance approval, / refer to GI age 4+  DUE for FASTING BLOOD WORK (no food or drink after midnight before the lab appointment, only water or coffee without cream/sugar on the morning of)  SCHEDULE "Lab Only" visit in the morning at the clinic for lab draw in 1 YEAR  - Make sure Lab Only appointment is at about 1 week before your next appointment, so that results will be available  For Lab Results, once available within 2-3 days of blood draw, you can can log in to MyChart online to view your results and a brief explanation. Also, we can discuss results at next follow-up visit.    Please schedule a Follow-up Appointment to: Return in about 1 year (around 09/21/2021) for 1 year fasting lab only then 1 week later Annual Physical.  If you have any other questions or concerns, please feel free to call the office or send a message through MyChart. You may also schedule an earlier appointment if necessary.  Additionally, you may be receiving a survey about your experience at our office within a few days to 1 week by e-mail or mail. We value your feedback.  Saralyn Pilar, DO [facility] [place], [place]

## 2020-09-21 NOTE — Progress Notes (Signed)
Subjective:    Patient ID: Jesse Blanchard, male    DOB: 12/09/1985, 35 y.o.   MRN: [id number]  Jesse Blanchard is a 35 y.o. male presenting on 09/21/2020 for Annual Exam and Anxiety   HPI   Here for Annual Physical and Lab Review.   Asthma, moderate persistent Seasonal Allergies, Environmental Controlled on Breo - No recent flare up, hospitalization or night-time awakening - Denies wheezing dyspnea or cough  Follow-up GAD with history of panic Prior problem in past we have discussed, he has done well on Escitalopram. Needs refill Now he has developed more issues with fear and possible PTSD issues with bridges and heights. - He has had issues with some recurring memories that can bother him and cause worse anxiety.  Also he has few pills left on Lorazepam still, very rarely taking it at all. No longer having panic attacks. - Denies recent depression mood or panic  Elevated A1c Prior history 5.4 to 5.6 Fam history DM  HYPERLIPIDEMIA: Last lipid panel 08/2020, mild elevated LDL 150s, improved from previous 180s Admits some poor higher cholesterol diet.  BMI >27 Wt down 12 lbs since prior visit. He admits recent vacation and ate junk foods etc.  Health Maintenance: He has fam history of colon cancer on mothers side of family possibly early 21s age. He is asking about future screening.  He asks about heart disease history family history strong w/ father having heart attack age 73. He asks about testing and screening. He is asymptomatic.  Depression screen [facility] 2/9 09/21/2020 07/27/2020 08/08/2019  Decreased Interest 0 0 1  Down, Depressed, Hopeless 0 0 0  PHQ - 2 Score 0 0 1  Altered sleeping 2 - -  Tired, decreased energy 2 - -  Change in appetite 0 - -  Feeling bad or failure about yourself  0 - -  Trouble concentrating 0 - -  Suicidal thoughts 0 - -  PHQ-9 Score 4 - -  Difficult doing work/chores Not difficult at all - -   GAD 7 : Generalized Anxiety Score 09/21/2020  07/27/2020 08/08/2019 02/11/2019  Nervous, Anxious, on Edge 2 2 2 2   Control/stop worrying 2 3 2 1   Worry too much - different things 2 2 2 3   Trouble relaxing 3 3 3 3   Restless 3 1 1 2   Easily annoyed or irritable 1 1 2 2   Afraid - awful might happen 1 1 1 1   Total GAD 7 Score 14 13 13 14   Anxiety Difficulty Somewhat difficult Not difficult at all Somewhat difficult Not difficult at all     Past Medical History:  Diagnosis Date  . Asthma    Past Surgical History:  Procedure Laterality Date  . NO PAST SURGERIES     Social History   Socioeconomic History  . Marital status: Married    Spouse name: Not on file  . Number of children: Not on file  . Years of education:  . Highest education level: Not on file  Occupational History  . Occupation: , [employer]    Comment:  )  Tobacco Use  . Smoking status: Never Smoker  . Smokeless tobacco: Current User    Types: Chew  . Tobacco comment: 1 can chewing tobacco daily, 15 years  Vaping Use  . Vaping Use: Never used  Substance and Sexual Activity  . Alcohol use: Yes    Comment: occasionally weekends  . Drug use: No  . Sexual activity: Not  on file  Other Topics Concern  . Not on file  Social History Narrative  . Not on file   Social Determinants of Health   Financial Resource Strain: Not on file  Food Insecurity: Not on file  Transportation Needs: Not on file  Physical Activity: Not on file  Stress: Not on file  Social Connections: Not on file  Intimate Partner Violence: Not on file   Family History  Problem Relation Age of Onset  . Diabetes Mother   . Heart disease Father   . Diabetes Father   . Heart attack Father 25       S/p CABG  . Heart disease Paternal Grandfather   . Heart attack Paternal Grandfather 48       repeat age 19 fatal  . Diabetes Paternal Grandfather   . Diabetes Sister   . Hypertension Sister   . Heart attack Maternal Grandmother 60  . Cancer Maternal  Grandfather        Undetermined, possible GI malignancy  . Diabetes Paternal Uncle   . Colon cancer Maternal Uncle   . Prostate cancer Neg Hx    Current Outpatient Medications on File Prior to Visit  Medication Sig  . albuterol (PROAIR HFA) 108 (90 Base) MCG/ACT inhaler Inhale 2 puffs into the lungs every 4 (four) hours as needed for wheezing or shortness of breath.  Marland Kitchen BREO ELLIPTA 200-25 MCG/INH AEPB Inhale 1 puff into the lungs daily.  Marland Kitchen escitalopram (LEXAPRO) 10 MG tablet Take 1 tablet (10 mg total) by mouth daily.  . fluticasone (FLONASE) 50 MCG/ACT nasal spray Place 2 sprays into both nostrils daily. Use for 4-6 weeks then stop and use seasonally or as needed.  Marland Kitchen LORazepam (ATIVAN) 0.5 MG tablet Take 1 tablet (0.5 mg total) by mouth daily as needed for anxiety.  . montelukast (SINGULAIR) 10 MG tablet Take 1 tablet (10 mg total) by mouth at bedtime.  . Pseudoeph-Doxylamine-DM-APAP (NYQUIL PO) Take by mouth. Taking as needed   No current facility-administered medications on file prior to visit.    Review of Systems  Constitutional: Negative for activity change, appetite change, chills, diaphoresis, fatigue and fever.  HENT: Negative for congestion and hearing loss.   Eyes: Negative for visual disturbance.  Respiratory: Negative for cough, chest tightness, shortness of breath and wheezing.   Cardiovascular: Negative for chest pain, palpitations and leg swelling.  Gastrointestinal: Negative for abdominal pain, constipation, diarrhea, nausea and vomiting.  Genitourinary: Negative for dysuria, frequency and hematuria.  Musculoskeletal: Negative for arthralgias and neck pain.  Skin: Negative for rash.  Neurological: Negative for dizziness, weakness, light-headedness, numbness and headaches.  Hematological: Negative for adenopathy.  Psychiatric/Behavioral: Negative for behavioral problems, dysphoric mood and sleep disturbance.   Per HPI unless specifically indicated above        Objective:    BP 123/77   Pulse 65   Ht 6\' 2"  (1.88 m)   Wt 212 lb 3.2 oz (96.3 kg)   SpO2 99%   BMI 27.24 kg/m   Wt Readings from Last 3 Encounters:  09/21/20 212 lb 3.2 oz (96.3 kg)  07/27/20 218 lb 9.6 oz (99.2 kg)  08/08/19 224 lb (101.6 kg)    Physical Exam Vitals and nursing note reviewed.  Constitutional:      General: He is not in acute distress.    Appearance: He is well-developed. He is not diaphoretic.     Comments: Well-appearing, comfortable, cooperative  HENT:     Head: Normocephalic and atraumatic.  Eyes:     General:        Right eye: No discharge.        Left eye: No discharge.     Conjunctiva/sclera: Conjunctivae normal.  Neck:     Thyroid: No thyromegaly.     Vascular: No carotid bruit.  Cardiovascular:     Rate and Rhythm: Normal rate and regular rhythm.     Heart sounds: Normal heart sounds. No murmur heard.   Pulmonary:     Effort: Pulmonary effort is normal. No respiratory distress.     Breath sounds: Normal breath sounds. No wheezing or rales.  Musculoskeletal:        General: Normal range of motion.     Cervical back: Normal range of motion and neck supple.     Right lower leg: No edema.     Left lower leg: No edema.  Lymphadenopathy:     Cervical: No cervical adenopathy.  Skin:    General: Skin is warm and dry.     Findings: No erythema or rash.  Neurological:     Mental Status: He is alert and oriented to person, place, and time.  Psychiatric:        Behavior: Behavior normal.     Comments: Well groomed, good eye contact, normal speech and thoughts       Results for orders placed or performed in visit on 09/14/20  TSH  Result Value Ref Range   TSH 1.78 0.40 - 4.50 mIU/L  Hepatitis C antibody  Result Value Ref Range   Hepatitis C Ab NON-REACTIVE NON-REACTIVE   SIGNAL TO CUT-OFF 0.00 <1.00  Lipid panel  Result Value Ref Range   Cholesterol 234 (H) <200 mg/dL   HDL 40 > OR = 40 mg/dL   Triglycerides 154 (H) <150 mg/dL    LDL Cholesterol (Calc) 157 (H) mg/dL (calc)   Total CHOL/HDL Ratio 5.9 (H) <5.0 (calc)   Non-HDL Cholesterol (Calc) 194 (H) <130 mg/dL (calc)  COMPLETE METABOLIC PANEL WITH GFR  Result Value Ref Range   Glucose, Bld 95 65 - 99 mg/dL   BUN 13 7 - 25 mg/dL   Creat 0.08 6.76 - 1.95 mg/dL   GFR, Est Non African American 90 > OR = 60 mL/min/1.1m2   GFR, Est African American 105 > OR = 60 mL/min/1.30m2   BUN/Creatinine Ratio NOT APPLICABLE 6 - 22 (calc)   Sodium 141 135 - 146 mmol/L   Potassium 4.0 3.5 - 5.3 mmol/L   Chloride 105 98 - 110 mmol/L   CO2 29 20 - 32 mmol/L   Calcium 9.3 8.6 - 10.3 mg/dL   Total Protein 6.5 6.1 - 8.1 g/dL   Albumin 4.4 3.6 - 5.1 g/dL   Globulin 2.1 1.9 - 3.7 g/dL (calc)   AG Ratio 2.1 1.0 - 2.5 (calc)   Total Bilirubin 0.8 0.2 - 1.2 mg/dL   Alkaline phosphatase (APISO) 42 36 - 130 U/L   AST 16 10 - 40 U/L   ALT 29 9 - 46 U/L  CBC with Differential/Platelet  Result Value Ref Range   WBC 6.5 3.8 - 10.8 Thousand/uL   RBC 5.08 4.20 - 5.80 Million/uL   Hemoglobin 15.5 13.2 - 17.1 g/dL   HCT 09.3 26.7 - 12.4 %   MCV 92.9 80.0 - 100.0 fL   MCH 30.5 27.0 - 33.0 pg   MCHC 32.8 32.0 - 36.0 g/dL   RDW 58.0 99.8 - 33.8 %   Platelets 249 140 - 400  Thousand/uL   MPV 10.8 7.5 - 12.5 fL   Neutro Abs 3,055 1,500 - 7,800 cells/uL   Lymphs Abs 2,308 850 - 3,900 cells/uL   Absolute Monocytes 455 200 - 950 cells/uL   Eosinophils Absolute 572 (H) 15 - 500 cells/uL   Basophils Absolute 111 0 - 200 cells/uL   Neutrophils Relative % 47 %   Total Lymphocyte 35.5 %   Monocytes Relative 7.0 %   Eosinophils Relative 8.8 %   Basophils Relative 1.7 %  Hemoglobin A1c  Result Value Ref Range   Hgb A1c MFr Bld 5.5 <5.7 % of total Hgb   Mean Plasma Glucose 111 mg/dL   eAG (mmol/L) 6.2 mmol/L      Assessment & Plan:   Problem List Items Addressed This Visit    Moderate persistent asthma without complication    Controlled currently on Breo Continues Singulair Follow-up  as need      Mixed hyperlipidemia    Mild elevated LDL >150, improved from prior 180-190 Concern fam history heart disease early, in father age 36 The ASCVD Risk score (Goff DC Jr., et al., 2013) failed to calculate for the following reasons:   The 2013 ASCVD risk score is only valid for ages 7540 to 9279  Plan: Focus on improving lifestyle for LDL management Discussed goal for future can consider medication, in future discussed prevention for cardiovascular and when to seek screening with further work up age 36+  Future consider adv lipid panel w/ inflammation testing      Generalized anxiety disorder with panic attacks    Remains significantly improved overall GAD on SSRI rarely use BDZ Lorazepam History of rare panic attack No depression - No prior dx / Psych / counseling  Plan: 1. Continue Escitalopram SSRI - May use Lorapzeam existing rx remaining few pills PRN only - Future may benefit from therapist if worse anxiety      Elevated hemoglobin A1c    Improved A1c now to 5.5, below range of PreDM Encourage continue improved lifestyle       Other Visit Diagnoses    Annual physical exam    -  Primary   BMI 27.0-27.9,adult          Updated Health Maintenance information Reviewed recent lab results with patient Encouraged improvement to lifestyle with diet and exercise Goal of weight loss   No orders of the defined types were placed in this encounter.     Follow up plan: Return in about 1 year (around 09/21/2021) for 1 year fasting lab only then 1 week later Annual Physical.  Future labs 09/19/2021  Saralyn PilarAlexander Maximiliano Cromartie, DO [facility] 09/21/2020, 10:57 AM

## 2020-09-21 NOTE — Assessment & Plan Note (Signed)
Mild elevated LDL >150, improved from prior 180-190 Concern fam history heart disease early, in father age 36 The ASCVD Risk score Denman George DC Jr., et al., 2013) failed to calculate for the following reasons:   The 2013 ASCVD risk score is only valid for ages 61 to 24  Plan: Focus on improving lifestyle for LDL management Discussed goal for future can consider medication, in future discussed prevention for cardiovascular and when to seek screening with further work up age 2+  Future consider adv lipid panel w/ inflammation testing

## 2020-09-21 NOTE — Assessment & Plan Note (Signed)
Remains significantly improved overall GAD on SSRI rarely use BDZ Lorazepam History of rare panic attack No depression - No prior dx / Psych / counseling  Plan: 1. Continue Escitalopram SSRI - May use Lorapzeam existing rx remaining few pills PRN only - Future may benefit from therapist if worse anxiety

## 2021-03-26 ENCOUNTER — Other Ambulatory Visit: Payer: Self-pay | Admitting: Family Medicine

## 2021-03-26 DIAGNOSIS — J4540 Moderate persistent asthma, uncomplicated: Secondary | ICD-10-CM

## 2021-03-26 NOTE — Telephone Encounter (Signed)
Requested medications are due for refill today.  yes  Requested medications are on the active medications list.  yes  Last refill. 07/30/2020  Future visit scheduled.   no  Notes to clinic.  Pharmacy needs Dx code.    Requested Prescriptions  Pending Prescriptions Disp Refills   albuterol (VENTOLIN HFA) 108 (90 Base) MCG/ACT inhaler [Pharmacy Med Name: ALBUTEROL HFA (PROAIR) INHALER] 8.5 each 5    Sig: INHALE 2 PUFFS INTO THE LUNGS EVERY 4 HOURS AS NEEDED FOR WHEEZING OR SHORTNESS OF BREATH.     Pulmonology:  Beta Agonists Failed - 03/26/2021  7:12 PM      Failed - One inhaler should last at least one month. If the patient is requesting refills earlier, contact the patient to check for uncontrolled symptoms.      Passed - Valid encounter within last 12 months    Recent Outpatient Visits           6 months ago Annual physical exam   [facility] Smitty Cords, DO   8 months ago Moderate persistent asthma without complication   [facility] [place], Netta Neat, DO   1 year ago Suspected sleep apnea   [facility] Smitty Cords, DO   2 years ago Annual physical exam   [facility] [place], Netta Neat, DO   2 years ago Generalized anxiety disorder with panic attacks   [facility] [place], Netta Neat, [place]

## 2021-04-02 ENCOUNTER — Ambulatory Visit: Payer: PRIVATE HEALTH INSURANCE | Admitting: Family Medicine

## 2021-06-26 ENCOUNTER — Ambulatory Visit (INDEPENDENT_AMBULATORY_CARE_PROVIDER_SITE_OTHER): Payer: PRIVATE HEALTH INSURANCE | Admitting: Family Medicine

## 2021-06-26 ENCOUNTER — Other Ambulatory Visit: Payer: Self-pay

## 2021-06-26 ENCOUNTER — Encounter: Payer: Self-pay | Admitting: Family Medicine

## 2021-06-26 VITALS — BP 138/94 | HR 86 | Ht 74.0 in | Wt 231.2 lb

## 2021-06-26 DIAGNOSIS — F411 Generalized anxiety disorder: Secondary | ICD-10-CM | POA: Diagnosis not present

## 2021-06-26 DIAGNOSIS — F410 Panic disorder [episodic paroxysmal anxiety] without agoraphobia: Secondary | ICD-10-CM

## 2021-06-26 DIAGNOSIS — J4540 Moderate persistent asthma, uncomplicated: Secondary | ICD-10-CM | POA: Diagnosis not present

## 2021-06-26 DIAGNOSIS — Z3009 Encounter for other general counseling and advice on contraception: Secondary | ICD-10-CM

## 2021-06-26 MED ORDER — SYMBICORT 80-4.5 MCG/ACT IN AERO: 2.0000 | INHALATION_SPRAY | Freq: Two times a day (BID) | RESPIRATORY_TRACT | 12 refills | Status: DC

## 2021-06-26 MED ORDER — ESCITALOPRAM OXALATE 10 MG PO TABS: 10.0000 mg | ORAL_TABLET | Freq: Every day | ORAL | 3 refills | Status: DC

## 2021-06-26 MED ORDER — LORAZEPAM 0.5 MG PO TABS: 0.5000 mg | ORAL_TABLET | Freq: Every day | ORAL | 2 refills | Status: DC | PRN

## 2021-06-26 MED ORDER — ALBUTEROL SULFATE HFA 108 (90 BASE) MCG/ACT IN AERS: 2.0000 | INHALATION_SPRAY | RESPIRATORY_TRACT | 5 refills | Status: DC | PRN

## 2021-06-26 NOTE — Progress Notes (Signed)
? ?Subjective:  ? ? Patient ID: Jesse Blanchard, male    DOB: 1985-12-03, 36 y.o.   MRN: [id number] ? ?Jesse PriestJonathan Blanchard is a 36 y.o. male presenting on 06/26/2021 for Anxiety ? ? ?HPI ? ?GAD with history of panic ?Update since last visit, he is doing well with anxiety management ?He says takes Escitalopram 10mg  daily during course of time that he needs improvement on anxiety with certain stressors or trips or obligations. Will usually take it regularly for several weeks or can pause for a period of time. Seems to work well with intermittent dosing. ?- Additionally taking Lorazepam for more acute stressors and situational, with good results 0.5mg  PRN dosing, now nearly out of medication, given 10 pills x 1 refill back in Summer of 2022. Not using often.  ?- He has had issues with some recurring memories that can bother him and cause worse anxiety. ?No longer having panic attacks. ?- Denies recent depression mood or panic ? ?Vasectomy Consult ?He is interested to speak with Urologist about vasectomy procedure. He desires permanent contraception. ? ?Moderate persistent asthma ?Remains off maintenance inhaler due to ins no longer covering ?Needs Albuterol re order ?Did well on symbicort, asking about re order ? ?[facility]21 09/21/2020 07/27/2020  ?Decreased Interest 0 0 0  ?Down, Depressed, Hopeless 0 0 0  ?PHQ - 2 Score 0 0 0  ?Altered sleeping 1 2 -  ?Tired, decreased energy 1 2 -  ?Change in appetite 1 0 -  ?Feeling bad or failure about yourself  0 0 -  ?Trouble concentrating 2 0 -  ?Moving slowly or fidgety/restless 0 - -  ?Suicidal thoughts 0 0 -  ?PHQ-9 Score 5 4 -  ?Difficult doing work/chores Not difficult at all Not difficult at all -  ? ? ?Social History  ? ?Tobacco Use  ? Smoking status: Never  ? Smokeless tobacco: Current  ?  Types: Chew  ? Tobacco comments:  ?  1 can chewing tobacco daily, 15 years  ?Vaping Use  ? Vaping Use: Never used  ?Substance Use Topics  ? Alcohol use: Yes  ?  Comment:  occasionally weekends  ? Drug use: No  ? ? ?Review of Systems ?Per HPI unless specifically indicated above ? ?   ?Objective:  ?  ?BP (!) 138/94 (BP Location: Left Arm, Cuff Size: Normal)   Pulse 86   Ht 6\' 2"  (1.88 m)   Wt 231 lb 3.2 oz (104.9 kg)   SpO2 98%   BMI 29.68 kg/m?   ?Wt Readings from Last 3 Encounters:  ?06/26/21 231 lb 3.2 oz (104.9 kg)  ?09/21/20 212 lb 3.2 oz (96.3 kg)  ?07/27/20 218 lb 9.6 oz (99.2 kg)  ?  ?Physical Exam ?Vitals and nursing note reviewed.  ?Constitutional:   ?   General: He is not in acute distress. ?   Appearance: Normal appearance. He is well-developed. He is not diaphoretic.  ?   Comments: Well-appearing, comfortable, cooperative  ?HENT:  ?   Head: Normocephalic and atraumatic.  ?Eyes:  ?   General:     ?   Right eye: No discharge.     ?   Left eye: No discharge.  ?   Conjunctiva/sclera: Conjunctivae normal.  ?Cardiovascular:  ?   Rate and Rhythm: Normal rate.  ?Pulmonary:  ?   Effort: Pulmonary effort is normal.  ?Skin: ?   General: Skin is warm and dry.  ?   Findings: No erythema or rash.  ?  Neurological:  ?   Mental Status: He is alert and oriented to person, place, and time.  ?Psychiatric:     ?   Mood and Affect: Mood normal.     ?   Behavior: Behavior normal.     ?   Thought Content: Thought content normal.  ?   Comments: Well groomed, good eye contact, normal speech and thoughts  ? ? ? ?Results for orders placed or performed in visit on 09/14/20  ?TSH  ?Result Value Ref Range  ? TSH 1.78 0.40 - 4.50 mIU/L  ?Hepatitis C antibody  ?Result Value Ref Range  ? Hepatitis C Ab NON-REACTIVE NON-REACTIVE  ? SIGNAL TO CUT-OFF 0.00 <1.00  ?Lipid panel  ?Result Value Ref Range  ? Cholesterol 234 (H) <200 mg/dL  ? HDL 40 > OR = 40 mg/dL  ? Triglycerides 208 (H) <150 mg/dL  ? LDL Cholesterol (Calc) 157 (H) mg/dL (calc)  ? Total CHOL/HDL Ratio 5.9 (H) <5.0 (calc)  ? Non-HDL Cholesterol (Calc) 194 (H) <130 mg/dL (calc)  ?COMPLETE METABOLIC PANEL WITH GFR  ?Result Value Ref Range  ?  Glucose, Bld 95 65 - 99 mg/dL  ? BUN 13 7 - 25 mg/dL  ? Creat 1.06 0.60 - 1.35 mg/dL  ? GFR, Est Non African American 90 > OR = 60 mL/min/1.37m2  ? GFR, Est African American 105 > OR = 60 mL/min/1.69m2  ? BUN/Creatinine Ratio NOT APPLICABLE 6 - 22 (calc)  ? Sodium 141 135 - 146 mmol/L  ? Potassium 4.0 3.5 - 5.3 mmol/L  ? Chloride 105 98 - 110 mmol/L  ? CO2 29 20 - 32 mmol/L  ? Calcium 9.3 8.6 - 10.3 mg/dL  ? Total Protein 6.5 6.1 - 8.1 g/dL  ? Albumin 4.4 3.6 - 5.1 g/dL  ? Globulin 2.1 1.9 - 3.7 g/dL (calc)  ? AG Ratio 2.1 1.0 - 2.5 (calc)  ? Total Bilirubin 0.8 0.2 - 1.2 mg/dL  ? Alkaline phosphatase (APISO) 42 36 - 130 U/L  ? AST 16 10 - 40 U/L  ? ALT 29 9 - 46 U/L  ?CBC with Differential/Platelet  ?Result Value Ref Range  ? WBC 6.5 3.8 - 10.8 Thousand/uL  ? RBC 5.08 4.20 - 5.80 Million/uL  ? Hemoglobin 15.5 13.2 - 17.1 g/dL  ? HCT 47.2 38.5 - 50.0 %  ? MCV 92.9 80.0 - 100.0 fL  ? MCH 30.5 27.0 - 33.0 pg  ? MCHC 32.8 32.0 - 36.0 g/dL  ? RDW 12.0 11.0 - 15.0 %  ? Platelets 249 140 - 400 Thousand/uL  ? MPV 10.8 7.5 - 12.5 fL  ? Neutro Abs 3,055 1,500 - 7,800 cells/uL  ? Lymphs Abs 2,308 850 - 3,900 cells/uL  ? Absolute Monocytes 455 200 - 950 cells/uL  ? Eosinophils Absolute 572 (H) 15 - 500 cells/uL  ? Basophils Absolute 111 0 - 200 cells/uL  ? Neutrophils Relative % 47 %  ? Total Lymphocyte 35.5 %  ? Monocytes Relative 7.0 %  ? Eosinophils Relative 8.8 %  ? Basophils Relative 1.7 %  ?Hemoglobin A1c  ?Result Value Ref Range  ? Hgb A1c MFr Bld 5.5 <5.7 % of total Hgb  ? Mean Plasma Glucose 111 mg/dL  ? eAG (mmol/L) 6.2 mmol/L  ? ?   ?Assessment & Plan:  ? ?Problem List Items Addressed This Visit   ? ? Moderate persistent asthma without complication  ?  Re order Albuterol PRN ?Remains off maintenance inhaler due to insurance not covering ?  Re try symbicort, or can consider other options, asked him to find preference formulary list, he has failed advair before side effect ?  ?  ? Relevant Medications  ? albuterol  (VENTOLIN HFA) 108 (90 Base) MCG/ACT inhaler  ? SYMBICORT 80-4.5 MCG/ACT inhaler  ? Generalized anxiety disorder with panic attacks - Primary  ?  Remains significantly improved overall GAD on SSRI ?rarely use BDZ Lorazepam ?History of rare panic attack ?No depression ?- No prior dx / Psych / counseling ? ?PDMP reviewed ? ?Plan: ?1. Continue Escitalopram SSRI 10 mg daily ? ?Lorazepam low dose PRN 0.5mg  rare use, 10 pills last months, re order as back up plan, reviewed not indicated for chronic long term use ?- Future may benefit from therapist if worse anxiety ?  ?  ? Relevant Medications  ? escitalopram (LEXAPRO) 10 MG tablet  ? LORazepam (ATIVAN) 0.5 MG tablet  ? ?Other Visit Diagnoses   ? ? Vasectomy evaluation      ? Relevant Orders  ? Ambulatory referral to Urology  ? ?  ?  ? ? ?Refer to urology [facility] for vasectomy consult ? ?Orders Placed This Encounter  ?Procedures  ? Ambulatory referral to Urology  ?  Referral Priority:   Routine  ?  Referral Type:   Consultation  ?  Referral Reason:   Specialty Services Required  ?  Requested Specialty:   Urology  ?  Number of Visits Requested:   1  ? ? ? ?Meds ordered this encounter  ?Medications  ? escitalopram (LEXAPRO) 10 MG tablet  ?  Sig: Take 1 tablet (10 mg total) by mouth daily.  ?  Dispense:  90 tablet  ?  Refill:  3  ? LORazepam (ATIVAN) 0.5 MG tablet  ?  Sig: Take 1 tablet (0.5 mg total) by mouth daily as needed for anxiety.  ?  Dispense:  10 tablet  ?  Refill:  2  ? albuterol (VENTOLIN HFA) 108 (90 Base) MCG/ACT inhaler  ?  Sig: Inhale 2 puffs into the lungs every 4 (four) hours as needed for wheezing or shortness of breath.  ?  Dispense:  8.5 each  ?  Refill:  5  ?  DX Code Needed  J45.40  ? SYMBICORT 80-4.5 MCG/ACT inhaler  ?  Sig: Inhale 2 puffs into the lungs in the morning and at bedtime.  ?  Dispense:  1 each  ?  Refill:  12  ? ? ? ? ?Follow up plan: ?Return if symptoms worsen or fail to improve. ? ?Saralyn Pilar, DO ?[facility] ?[facility] ?06/26/2021, 9:40 AM ?

## 2021-06-26 NOTE — Assessment & Plan Note (Signed)
Re order Albuterol PRN ?Remains off maintenance inhaler due to insurance not covering ?Re try symbicort, or can consider other options, asked him to find preference formulary list, he has failed advair before side effect ?

## 2021-06-26 NOTE — Assessment & Plan Note (Signed)
Remains significantly improved overall GAD on SSRI rarely use BDZ Lorazepam History of rare panic attack No depression - No prior dx / Psych / counseling  PDMP reviewed  Plan: 1. Continue Escitalopram SSRI 10 mg daily  Lorazepam low dose PRN 0.5mg rare use, 10 pills last months, re order as back up plan, reviewed not indicated for chronic long term use - Future may benefit from therapist if worse anxiety 

## 2021-06-26 NOTE — Patient Instructions (Addendum)
Thank you for coming to the office today. ? ?Vasectomy referral ? ?[facility] ?[facility] -1st floor ?[address] ?[place],  [place]  [address] ?Phone: [phone] ? ?Refilled Escitalopram, Lorazepam added 2 refills ? ?Re ordered Albuterol, Symbicort, let me know if there is an alternative inhaler that is preferred. ? ? ?Please schedule a Follow-up Appointment to: Return if symptoms worsen or fail to improve. ? ?If you have any other questions or concerns, please feel free to call the office or send a message through MyChart. You may also schedule an earlier appointment if necessary. ? ?Additionally, you may be receiving a survey about your experience at our office within a few days to 1 week by e-mail or mail. We value your feedback. ? ?Saralyn Pilar, DO ?[facility], [place] ?

## 2021-07-08 ENCOUNTER — Ambulatory Visit: Payer: PRIVATE HEALTH INSURANCE | Admitting: Urology

## 2021-07-20 HISTORY — PX: SHOULDER SURGERY: SHX246

## 2021-07-29 ENCOUNTER — Other Ambulatory Visit: Payer: Self-pay | Admitting: Family Medicine

## 2021-07-29 DIAGNOSIS — J4540 Moderate persistent asthma, uncomplicated: Secondary | ICD-10-CM

## 2021-07-29 NOTE — Telephone Encounter (Signed)
Requested medication (s) are due for refill today: Yes ? ?Requested medication (s) are on the active medication list: Yes ? ?Last refill:  07/27/20 ? ?Future visit scheduled: No ? ?Notes to clinic:  Prescription expired. ? ? ? ?Requested Prescriptions  ?Pending Prescriptions Disp Refills  ? montelukast (SINGULAIR) 10 MG tablet [Pharmacy Med Name: MONTELUKAST SOD 10 MG TABLET] 90 tablet 3  ?  Sig: TAKE 1 TABLET BY MOUTH EVERYDAY AT BEDTIME  ?  ? Pulmonology:  Leukotriene Inhibitors Passed - 07/29/2021  2:13 AM  ?  ?  Passed - Valid encounter within last 12 months  ?  Recent Outpatient Visits   ? ?      ? 1 month ago Generalized anxiety disorder with panic attacks  ? [facility] Smitty Cords, DO  ? 10 months ago Annual physical exam  ? [facility] Smitty Cords, DO  ? 1 year ago Moderate persistent asthma without complication  ? [facility] [place], Netta Neat, DO  ? 1 year ago Suspected sleep apnea  ? [facility], Netta Neat, DO  ? 2 years ago Annual physical exam  ? [facility] Smitty Cords, DO  ? ?  ?  ? ?  ?  ?  ? ?

## 2021-12-24 ENCOUNTER — Other Ambulatory Visit: Payer: Self-pay | Admitting: Family Medicine

## 2021-12-24 DIAGNOSIS — F410 Panic disorder [episodic paroxysmal anxiety] without agoraphobia: Secondary | ICD-10-CM

## 2021-12-26 NOTE — Telephone Encounter (Signed)
Requested medication (s) are due for refill today - yes  Requested medication (s) are on the active medication list - yes  Future visit scheduled -no  Last refill: 06/26/21 #10 2RF  Notes to clinic: non delegated Rx  Requested Prescriptions  Pending Prescriptions Disp Refills   LORazepam (ATIVAN) 0.5 MG tablet [Pharmacy Med Name: LORAZEPAM 0.5 MG TABLET] 10 tablet     Sig: TAKE 1 TABLET BY MOUTH EVERY DAY AS NEEDED FOR ANXIETY     Not Delegated - Psychiatry: Anxiolytics/Hypnotics 2 Failed - 12/24/2021  7:06 PM      Failed - This refill cannot be delegated      Failed - Urine Drug Screen completed in last 360 days      Failed - Valid encounter within last 6 months    Recent Outpatient Visits           6 months ago Generalized anxiety disorder with panic attacks   [facility] Smitty Cords, DO   1 year ago Annual physical exam   [facility] Smitty Cords, DO   1 year ago Moderate persistent asthma without complication   [facility] [place], Netta Neat, DO   2 years ago Suspected sleep apnea   [facility] [place], Netta Neat, DO   2 years ago Annual physical exam   [facility] Smitty Cords, DO              Passed - Patient is not pregnant         Requested Prescriptions  Pending Prescriptions Disp Refills   LORazepam (ATIVAN) 0.5 MG tablet [Pharmacy Med Name: LORAZEPAM 0.5 MG TABLET] 10 tablet     Sig: TAKE 1 TABLET BY MOUTH EVERY DAY AS NEEDED FOR ANXIETY     Not Delegated - Psychiatry: Anxiolytics/Hypnotics 2 Failed - 12/24/2021  7:06 PM      Failed - This refill cannot be delegated      Failed - Urine Drug Screen completed in last 360 days      Failed - Valid encounter within last 6 months    Recent Outpatient Visits           6 months ago Generalized anxiety disorder with panic attacks   [facility] Smitty Cords, DO   1 year ago Annual physical exam   [facility] Smitty Cords, DO   1 year ago Moderate persistent asthma without complication   [facility] [place], Netta Neat, DO   2 years ago Suspected sleep apnea   [facility] Smitty Cords, DO   2 years ago Annual physical exam   [facility] Smitty Cords, DO              Passed - Patient is not pregnant

## 2022-03-05 ENCOUNTER — Other Ambulatory Visit: Payer: Self-pay | Admitting: Family Medicine

## 2022-03-05 DIAGNOSIS — J4540 Moderate persistent asthma, uncomplicated: Secondary | ICD-10-CM

## 2022-03-06 NOTE — Telephone Encounter (Signed)
Requested Prescriptions  Pending Prescriptions Disp Refills   albuterol (VENTOLIN HFA) 108 (90 Base) MCG/ACT inhaler [Pharmacy Med Name: ALBUTEROL HFA (PROAIR) INHALER] 8.5 each 2    Sig: INHALE 2 PUFFS INTO THE LUNGS EVERY 4 HOURS AS NEEDED FOR WHEEZING OR SHORTNESS OF BREATH.     Pulmonology:  Beta Agonists 2 Failed - 03/05/2022  2:49 PM      Failed - Last BP in normal range    BP Readings from Last 1 Encounters:  06/26/21 (!) 138/94         Passed - Last Heart Rate in normal range    Pulse Readings from Last 1 Encounters:  06/26/21 86         Passed - Valid encounter within last 12 months    Recent Outpatient Visits           8 months ago Generalized anxiety disorder with panic attacks   [facility] [place], Netta Neat, DO   1 year ago Annual physical exam   [facility] Smitty Cords, DO   1 year ago Moderate persistent asthma without complication   [facility] [place], Netta Neat, DO   2 years ago Suspected sleep apnea   [facility] Smitty Cords, DO   3 years ago Annual physical exam   [facility] [place], Netta Neat, DO

## 2022-06-30 ENCOUNTER — Other Ambulatory Visit: Payer: Self-pay | Admitting: Family Medicine

## 2022-06-30 DIAGNOSIS — J4540 Moderate persistent asthma, uncomplicated: Secondary | ICD-10-CM

## 2022-07-01 NOTE — Telephone Encounter (Signed)
Attempted to call patient to schedule appointment- left message to call office. Courtesy RF given. Requested Prescriptions  Pending Prescriptions Disp Refills   albuterol (VENTOLIN HFA) 108 (90 Base) MCG/ACT inhaler [Pharmacy Med Name: ALBUTEROL HFA (PROAIR) INHALER] 8.5 each 2    Sig: INHALE 2 PUFFS BY MOUTH EVERY 4 HOURS AS NEEDED FOR WHEEZE OR FOR SHORTNESS OF BREATH     Pulmonology:  Beta Agonists 2 Failed - 06/30/2022  5:31 PM      Failed - Last BP in normal range    BP Readings from Last 1 Encounters:  06/26/21 (!) 138/94         Failed - Valid encounter within last 12 months    Recent Outpatient Visits           1 year ago Generalized anxiety disorder with panic attacks   [place], DO   1 year ago Annual physical exam   [facility] Olin Hauser, DO   1 year ago Moderate persistent asthma without complication   [place], DO   2 years ago Suspected sleep apnea   [place], DO   3 years ago Annual physical exam   [place], DO              Passed - Last Heart Rate in normal range    Pulse Readings from Last 1 Encounters:  06/26/21 86

## 2022-07-04 ENCOUNTER — Other Ambulatory Visit: Payer: Self-pay | Admitting: Family Medicine

## 2022-07-04 DIAGNOSIS — F410 Panic disorder [episodic paroxysmal anxiety] without agoraphobia: Secondary | ICD-10-CM

## 2022-07-04 NOTE — Telephone Encounter (Signed)
Patient needs OV, will refill medication for 30 days until OV can be made. OV needed for additional refills.  Requested Prescriptions  Pending Prescriptions Disp Refills   escitalopram (LEXAPRO) 10 MG tablet [Pharmacy Med Name: ESCITALOPRAM 10 MG TABLET] 30 tablet 0    Sig: TAKE 1 TABLET BY MOUTH EVERY DAY     Psychiatry:  Antidepressants - SSRI Failed - 07/04/2022  1:47 AM      Failed - Valid encounter within last 6 months    Recent Outpatient Visits           1 year ago Generalized anxiety disorder with panic attacks   [place], DO   1 year ago Annual physical exam   [facility] Olin Hauser, DO   1 year ago Moderate persistent asthma without complication   [place], DO   2 years ago Suspected sleep apnea   [place], DO   3 years ago Annual physical exam   [facility] [place], Devonne Doughty, [place]

## 2022-07-09 ENCOUNTER — Ambulatory Visit (INDEPENDENT_AMBULATORY_CARE_PROVIDER_SITE_OTHER): Payer: PRIVATE HEALTH INSURANCE | Admitting: Family Medicine

## 2022-07-09 ENCOUNTER — Encounter: Payer: Self-pay | Admitting: Family Medicine

## 2022-07-09 ENCOUNTER — Other Ambulatory Visit: Payer: Self-pay | Admitting: Family Medicine

## 2022-07-09 VITALS — BP 142/90 | HR 94 | Ht 74.0 in | Wt 234.4 lb

## 2022-07-09 DIAGNOSIS — R7309 Other abnormal glucose: Secondary | ICD-10-CM

## 2022-07-09 DIAGNOSIS — E782 Mixed hyperlipidemia: Secondary | ICD-10-CM

## 2022-07-09 DIAGNOSIS — F410 Panic disorder [episodic paroxysmal anxiety] without agoraphobia: Secondary | ICD-10-CM

## 2022-07-09 DIAGNOSIS — I10 Essential (primary) hypertension: Secondary | ICD-10-CM | POA: Diagnosis not present

## 2022-07-09 DIAGNOSIS — J4540 Moderate persistent asthma, uncomplicated: Secondary | ICD-10-CM

## 2022-07-09 DIAGNOSIS — F411 Generalized anxiety disorder: Secondary | ICD-10-CM | POA: Diagnosis not present

## 2022-07-09 DIAGNOSIS — Z0000 Encounter for general adult medical examination without abnormal findings: Secondary | ICD-10-CM

## 2022-07-09 MED ORDER — ESCITALOPRAM OXALATE 10 MG PO TABS: 10.0000 mg | ORAL_TABLET | Freq: Every day | ORAL | 3 refills | Status: AC

## 2022-07-09 MED ORDER — FLUTICASONE-SALMETEROL 250-50 MCG/ACT IN AEPB: 1.0000 | INHALATION_SPRAY | Freq: Two times a day (BID) | RESPIRATORY_TRACT | 2 refills | Status: DC

## 2022-07-09 MED ORDER — AMLODIPINE BESYLATE 5 MG PO TABS: 5.0000 mg | ORAL_TABLET | Freq: Every day | ORAL | 1 refills | Status: DC

## 2022-07-09 MED ORDER — MONTELUKAST SODIUM 10 MG PO TABS
10.0000 mg | ORAL_TABLET | Freq: Every day | ORAL | 3 refills | Status: AC
Start: 2022-07-09 — End: ?

## 2022-07-09 MED ORDER — LORAZEPAM 0.5 MG PO TABS: ORAL_TABLET | ORAL | 3 refills | Status: DC

## 2022-07-09 NOTE — Assessment & Plan Note (Signed)
New dx HYPERTENSION Elevated BP at home and in office  No known complications  Anxiety related   Plan:  1. START new med Amlodipine 5mg  daily, counseling on possible edema 2. Encourage improved lifestyle - low sodium diet, regular exercise 3. Continue monitor BP outside office, bring readings to next visit, if persistently >140/90 or new symptoms notify office sooner 4. Follow-up labs and visit 7-8 weeks

## 2022-07-09 NOTE — Progress Notes (Signed)
Subjective:    Patient ID: Jesse Blanchard, male    DOB: March 22, 1986, 37 y.o.   MRN: [id number]  Jesse Blanchard is a 37 y.o. male presenting on 07/09/2022 for Anxiety and Hypertension   HPI  Moderate persistent asthma Uncontrolled, off maintenance Using Albuterol 2-3 times per day, usually in AM and Evening Multiple triggers allergies Symbicort worked but no longer covered Asking about long term albuterol use vs new inhaler  CHRONIC HYPERTENSION, new diagnosis. Home readings 140-150s / higher 90s usually Elevated HR 90s He admits sometimes when stressed BP can be raised  Occasional alcohol, smokeless tobacco Current Meds - never on med prior   Denies CP, dyspnea, HA, edema, dizziness / lightheadedness  GAD with history of panic Update since last visit, he is doing well with anxiety management Continues Escitalopram 10mg  daily - Additionally taking Lorazepam for more acute stressors and situational, with good results 0.5mg  PRN dosing - He has had issues with some recurring memories that can bother him and cause worse anxiety. No longer having panic attacks. - Denies recent depression mood or panic  Nicotine dependence Smokeless Tobacco Nicotine content 1 can smokeless tobacco per day Goal to taper down on tobacco      07/09/2022    3:31 PM 06/26/2021    9:07 AM 09/21/2020   10:46 AM  Depression screen PHQ 2/9  Decreased Interest 0 0 0  Down, Depressed, Hopeless 0 0 0  PHQ - 2 Score 0 0 0  Altered sleeping 0 1 2  Tired, decreased energy 1 1 2   Change in appetite 0 1 0  Feeling bad or failure about yourself  0 0 0  Trouble concentrating 0 2 0  Moving slowly or fidgety/restless 0 0   Suicidal thoughts 0 0 0  PHQ-9 Score 1 5 4   Difficult doing work/chores Not difficult at all Not difficult at all Not difficult at all      07/09/2022    3:31 PM 06/26/2021    9:07 AM 09/21/2020   10:46 AM 07/27/2020   10:04 AM  GAD 7 : Generalized Anxiety Score  Nervous, Anxious, on Edge 1  2 2 2   Control/stop worrying 1 2 2 3   Worry too much - different things 3 2 2 2   Trouble relaxing 1 2 3 3   Restless 2 1 3 1   Easily annoyed or irritable 2 1 1 1   Afraid - awful might happen 1 1 1 1   Total GAD 7 Score 11 11 14 13   Anxiety Difficulty Somewhat difficult Somewhat difficult Somewhat difficult Not difficult at all      Social History   Tobacco Use   Smoking status: Never   Smokeless tobacco: Current    Types: Chew   Tobacco comments:    1 can chewing tobacco daily, 15 years  Vaping Use   Vaping Use: Never used  Substance Use Topics   Alcohol use: Yes    Comment: occasionally weekends   Drug use: No    Review of Systems Per HPI unless specifically indicated above     Objective:    BP (!) 142/90 (BP Location: Left Arm, Cuff Size: Normal)   Pulse 94   Ht 6\' 2"  (1.88 m)   Wt 234 lb 6.4 oz (106.3 kg)   SpO2 98%   BMI 30.10 kg/m   Wt Readings from Last 3 Encounters:  07/09/22 234 lb 6.4 oz (106.3 kg)  06/26/21 231 lb 3.2 oz (104.9 kg)  09/21/20 212 lb 3.2  oz (96.3 kg)    Physical Exam Vitals and nursing note reviewed.  Constitutional:      General: He is not in acute distress.    Appearance: He is well-developed. He is not diaphoretic.     Comments: Well-appearing, comfortable, cooperative  HENT:     Head: Normocephalic and atraumatic.  Eyes:     General:        Right eye: No discharge.        Left eye: No discharge.     Conjunctiva/sclera: Conjunctivae normal.  Neck:     Thyroid: No thyromegaly.  Cardiovascular:     Rate and Rhythm: Normal rate and regular rhythm.     Pulses: Normal pulses.     Heart sounds: Normal heart sounds. No murmur heard. Pulmonary:     Effort: Pulmonary effort is normal. No respiratory distress.     Breath sounds: Wheezing present. No rales.  Musculoskeletal:        General: Normal range of motion.     Cervical back: Normal range of motion and neck supple.  Lymphadenopathy:     Cervical: No cervical adenopathy.   Skin:    General: Skin is warm and dry.     Findings: No erythema or rash.  Neurological:     Mental Status: He is alert and oriented to person, place, and time. Mental status is at baseline.  Psychiatric:        Behavior: Behavior normal.     Comments: Well groomed, good eye contact, normal speech and thoughts       Results for orders placed or performed in visit on 09/14/20  TSH  Result Value Ref Range   TSH 1.78 0.40 - 4.50 mIU/L  Hepatitis C antibody  Result Value Ref Range   Hepatitis C Ab NON-REACTIVE NON-REACTIVE   SIGNAL TO CUT-OFF 0.00 <1.00  Lipid panel  Result Value Ref Range   Cholesterol 234 (H) <200 mg/dL   HDL 40 > OR = 40 mg/dL   Triglycerides 208 (H) <150 mg/dL   LDL Cholesterol (Calc) 157 (H) mg/dL (calc)   Total CHOL/HDL Ratio 5.9 (H) <5.0 (calc)   Non-HDL Cholesterol (Calc) 194 (H) <130 mg/dL (calc)  COMPLETE METABOLIC PANEL WITH GFR  Result Value Ref Range   Glucose, Bld 95 65 - 99 mg/dL   BUN 13 7 - 25 mg/dL   Creat 1.06 0.60 - 1.35 mg/dL   GFR, Est Non African American 90 > OR = 60 mL/min/1.23m2   GFR, Est African American 105 > OR = 60 mL/min/1.2m2   BUN/Creatinine Ratio NOT APPLICABLE 6 - 22 (calc)   Sodium 141 135 - 146 mmol/L   Potassium 4.0 3.5 - 5.3 mmol/L   Chloride 105 98 - 110 mmol/L   CO2 29 20 - 32 mmol/L   Calcium 9.3 8.6 - 10.3 mg/dL   Total Protein 6.5 6.1 - 8.1 g/dL   Albumin 4.4 3.6 - 5.1 g/dL   Globulin 2.1 1.9 - 3.7 g/dL (calc)   AG Ratio 2.1 1.0 - 2.5 (calc)   Total Bilirubin 0.8 0.2 - 1.2 mg/dL   Alkaline phosphatase (APISO) 42 36 - 130 U/L   AST 16 10 - 40 U/L   ALT 29 9 - 46 U/L  CBC with Differential/Platelet  Result Value Ref Range   WBC 6.5 3.8 - 10.8 Thousand/uL   RBC 5.08 4.20 - 5.80 Million/uL   Hemoglobin 15.5 13.2 - 17.1 g/dL   HCT 47.2 38.5 - 50.0 %  MCV 92.9 80.0 - 100.0 fL   MCH 30.5 27.0 - 33.0 pg   MCHC 32.8 32.0 - 36.0 g/dL   RDW 12.0 11.0 - 15.0 %   Platelets 249 140 - 400 Thousand/uL   MPV  10.8 7.5 - 12.5 fL   Neutro Abs 3,055 1,500 - 7,800 cells/uL   Lymphs Abs 2,308 850 - 3,900 cells/uL   Absolute Monocytes 455 200 - 950 cells/uL   Eosinophils Absolute 572 (H) 15 - 500 cells/uL   Basophils Absolute 111 0 - 200 cells/uL   Neutrophils Relative % 47 %   Total Lymphocyte 35.5 %   Monocytes Relative 7.0 %   Eosinophils Relative 8.8 %   Basophils Relative 1.7 %  Hemoglobin A1c  Result Value Ref Range   Hgb A1c MFr Bld 5.5 <5.7 % of total Hgb   Mean Plasma Glucose 111 mg/dL   eAG (mmol/L) 6.2 mmol/L      Assessment & Plan:   Problem List Items Addressed This Visit     Elevated hemoglobin A1c   Essential hypertension    New dx HYPERTENSION Elevated BP at home and in office  No known complications  Anxiety related   Plan:  1. START new med Amlodipine 5mg  daily, counseling on possible edema 2. Encourage improved lifestyle - low sodium diet, regular exercise 3. Continue monitor BP outside office, bring readings to next visit, if persistently >140/90 or new symptoms notify office sooner 4. Follow-up labs and visit 7-8 weeks       Relevant Medications   amLODipine (NORVASC) 5 MG tablet   Generalized anxiety disorder with panic attacks - Primary    Remains significantly improved overall GAD on SSRI rarely use BDZ Lorazepam History of rare panic attack No depression - No prior dx / Psych / counseling  PDMP reviewed  Plan: 1. Continue Escitalopram SSRI 10 mg daily  Lorazepam low dose PRN 0.5mg  rare use, 10 pills last months, re order as back up plan, reviewed not indicated for chronic long term use - Future may benefit from therapist if worse anxiety      Relevant Medications   escitalopram (LEXAPRO) 10 MG tablet   LORazepam (ATIVAN) 0.5 MG tablet   Mixed hyperlipidemia   Relevant Medications   amLODipine (NORVASC) 5 MG tablet   Moderate persistent asthma without complication    Uncontrolled currently Daily use Albuterol inappropriate Improved on  Singulair for asthma + allergy Previously on Symbicort but not covered Switch to generic Advair ordered today 1 puff TWICE A DAY, if intolerance we can try to find alternative, check formulary list      Relevant Medications   fluticasone-salmeterol (ADVAIR) 250-50 MCG/ACT AEPB   montelukast (SINGULAIR) 10 MG tablet       Goal to get to about half the amount of smokeless tobacco and then we can intervene with nicotine replacement therapy (gum patches lozenge etc) or we can consider Rx Wellbutrin - or Chantix.  [place] Quitline  1 800-QUIT NOW   Meds ordered this encounter  Medications   amLODipine (NORVASC) 5 MG tablet    Sig: Take 1 tablet (5 mg total) by mouth daily.    Dispense:  90 tablet    Refill:  1   escitalopram (LEXAPRO) 10 MG tablet    Sig: Take 1 tablet (10 mg total) by mouth daily.    Dispense:  90 tablet    Refill:  3   LORazepam (ATIVAN) 0.5 MG tablet    Sig: TAKE 1 TABLET  BY MOUTH EVERY DAY AS NEEDED FOR ANXIETY    Dispense:  10 tablet    Refill:  3    This request is for a new prescription for a controlled substance as required by Federal/State law.   fluticasone-salmeterol (ADVAIR) 250-50 MCG/ACT AEPB    Sig: Inhale 1 puff into the lungs in the morning and at bedtime.    Dispense:  60 each    Refill:  2   montelukast (SINGULAIR) 10 MG tablet    Sig: Take 1 tablet (10 mg total) by mouth at bedtime.    Dispense:  90 tablet    Refill:  3      Follow up plan: Return in about 7 weeks (around 08/27/2022) for 7 weeks fasting lab only then 1 week later Annual Physical.  Future labs ordered for 08/2022  Nobie Putnam, [facility] 07/09/2022, 3:53 PM

## 2022-07-09 NOTE — Assessment & Plan Note (Signed)
Remains significantly improved overall GAD on SSRI rarely use BDZ Lorazepam History of rare panic attack No depression - No prior dx / Psych / counseling  PDMP reviewed  Plan: 1. Continue Escitalopram SSRI 10 mg daily  Lorazepam low dose PRN 0.5mg  rare use, 10 pills last months, re order as back up plan, reviewed not indicated for chronic long term use - Future may benefit from therapist if worse anxiety

## 2022-07-09 NOTE — Patient Instructions (Addendum)
Thank you for coming to the office today.  Start Amlodipine 5mg  daily for BP Keep track of pressures, goal < 140 / 90 (preferred goal is < 135 / 85)  Pause ALbuterol Starting now with generic Advair 1 puff twice a day for maintenance Use albuterol only if needed  REfilled Singulair Montelukast  Refilled Escitalopram and Lorazepam.   Goal to get to about half the amount of smokeless tobacco and then we can intervene with nicotine replacement therapy (gum patches lozenge etc) or we can consider Rx Wellbutrin - or Chantix.  [place] Quitline  1 800-QUIT NOW  -----------------------------   DUE for FASTING BLOOD WORK (no food or drink after midnight before the lab appointment, only water or coffee without cream/sugar on the morning of)  SCHEDULE "Lab Only" visit in the morning at the clinic for lab draw in 6-8 WEEKS   - Make sure Lab Only appointment is at about 1 week before your next appointment, so that results will be available  For Lab Results, once available within 2-3 days of blood draw, you can can log in to MyChart online to view your results and a brief explanation. Also, we can discuss results at next follow-up visit.   Please schedule a Follow-up Appointment to: Return in about 7 weeks (around 08/27/2022) for 7 weeks fasting lab only then 1 week later Annual Physical.  If you have any other questions or concerns, please feel free to call the office or send a message through [facility]. You may also schedule an earlier appointment if necessary.  Additionally, you may be receiving a survey about your experience at our office within a few days to 1 week by e-mail or mail. We value your feedback.  Nobie Putnam, DO [place]

## 2022-07-09 NOTE — Assessment & Plan Note (Addendum)
Uncontrolled currently Daily use Albuterol inappropriate Improved on Singulair for asthma + allergy Previously on Symbicort but not covered Switch to generic Advair ordered today 1 puff TWICE A DAY, if intolerance we can try to find alternative, check formulary list

## 2022-08-13 ENCOUNTER — Other Ambulatory Visit: Payer: Self-pay | Admitting: Family Medicine

## 2022-08-13 DIAGNOSIS — J4540 Moderate persistent asthma, uncomplicated: Secondary | ICD-10-CM

## 2022-08-13 NOTE — Telephone Encounter (Signed)
Requested Prescriptions  Pending Prescriptions Disp Refills   albuterol (VENTOLIN HFA) 108 (90 Base) MCG/ACT inhaler [Pharmacy Med Name: ALBUTEROL HFA (PROAIR) INHALER] 8.5 each 0    Sig: INHALE 2 PUFFS BY MOUTH EVERY 4 HOURS AS NEEDED FOR WHEEZE OR FOR SHORTNESS OF BREATH     Pulmonology:  Beta Agonists 2 Failed - 08/13/2022  9:20 AM      Failed - Last BP in normal range    BP Readings from Last 1 Encounters:  07/09/22 (!) 142/90         Passed - Last Heart Rate in normal range    Pulse Readings from Last 1 Encounters:  07/09/22 94         Passed - Valid encounter within last 12 months    Recent Outpatient Visits           1 month ago Generalized anxiety disorder with panic attacks   [place] [facility] [place], Netta Neat, DO   1 year ago Generalized anxiety disorder with panic attacks   [place] [facility] Althea Charon, Netta Neat, DO   1 year ago Annual physical exam   [place] [facility] [place], Netta Neat, DO   2 years ago Moderate persistent asthma without complication   [place] [facility] [place], Netta Neat, DO   3 years ago Suspected sleep apnea   [facility] [place], Netta Neat, DO       Future Appointments             In 3 weeks Althea Charon, Netta Neat, DO  [facility], [facility]

## 2022-09-01 ENCOUNTER — Other Ambulatory Visit: Payer: PRIVATE HEALTH INSURANCE

## 2022-09-01 DIAGNOSIS — R7309 Other abnormal glucose: Secondary | ICD-10-CM

## 2022-09-01 DIAGNOSIS — E782 Mixed hyperlipidemia: Secondary | ICD-10-CM

## 2022-09-01 DIAGNOSIS — I10 Essential (primary) hypertension: Secondary | ICD-10-CM

## 2022-09-01 DIAGNOSIS — Z0000 Encounter for general adult medical examination without abnormal findings: Secondary | ICD-10-CM

## 2022-09-08 ENCOUNTER — Encounter: Payer: PRIVATE HEALTH INSURANCE | Admitting: Family Medicine

## 2022-09-10 ENCOUNTER — Other Ambulatory Visit: Payer: Self-pay | Admitting: Family Medicine

## 2022-09-10 ENCOUNTER — Other Ambulatory Visit: Payer: PRIVATE HEALTH INSURANCE

## 2022-09-10 DIAGNOSIS — J4540 Moderate persistent asthma, uncomplicated: Secondary | ICD-10-CM

## 2022-09-10 NOTE — Telephone Encounter (Signed)
Requested Prescriptions  Pending Prescriptions Disp Refills   albuterol (VENTOLIN HFA) 108 (90 Base) MCG/ACT inhaler [Pharmacy Med Name: ALBUTEROL HFA (PROAIR) INHALER] 8.5 each 0    Sig: INHALE 2 PUFFS BY MOUTH EVERY 4 HOURS AS NEEDED FOR WHEEZE OR FOR SHORTNESS OF BREATH     Pulmonology:  Beta Agonists 2 Failed - 09/10/2022  2:14 AM      Failed - Last BP in normal range    BP Readings from Last 1 Encounters:  07/09/22 (!) 142/90         Passed - Last Heart Rate in normal range    Pulse Readings from Last 1 Encounters:  07/09/22 94         Passed - Valid encounter within last 12 months    Recent Outpatient Visits           2 months ago Generalized anxiety disorder with panic attacks   [place] [facility] [place], Netta Neat, DO   1 year ago Generalized anxiety disorder with panic attacks   [place] [facility] Althea Charon, Netta Neat, DO   1 year ago Annual physical exam   [place] [facility] [place], Netta Neat, DO   2 years ago Moderate persistent asthma without complication   [place] [facility] [place], Netta Neat, DO   3 years ago Suspected sleep apnea   [facility] [facility] Althea Charon, Netta Neat, DO       Future Appointments             In 6 days Althea Charon, Netta Neat, DO [place] [facility], [facility]

## 2022-09-16 ENCOUNTER — Encounter: Payer: PRIVATE HEALTH INSURANCE | Admitting: Family Medicine

## 2022-09-29 ENCOUNTER — Other Ambulatory Visit: Payer: PRIVATE HEALTH INSURANCE

## 2022-09-30 LAB — CBC WITH DIFFERENTIAL/PLATELET
Absolute Monocytes: 496 cells/uL (ref 200–950)
Basophils Absolute: 117 cells/uL (ref 0–200)
Basophils Relative: 1.6 %
Eosinophils Absolute: 905 cells/uL — ABNORMAL HIGH (ref 15–500)
Eosinophils Relative: 12.4 %
HCT: 46.1 % (ref 38.5–50.0)
Hemoglobin: 15.7 g/dL (ref 13.2–17.1)
Lymphs Abs: 1913 cells/uL (ref 850–3900)
MCH: 31.2 pg (ref 27.0–33.0)
MCHC: 34.1 g/dL (ref 32.0–36.0)
MCV: 91.7 fL (ref 80.0–100.0)
MPV: 10.5 fL (ref 7.5–12.5)
Monocytes Relative: 6.8 %
Neutro Abs: 3869 cells/uL (ref 1500–7800)
Neutrophils Relative %: 53 %
Platelets: 267 10*3/uL (ref 140–400)
RBC: 5.03 10*6/uL (ref 4.20–5.80)
RDW: 12.8 % (ref 11.0–15.0)
Total Lymphocyte: 26.2 %
WBC: 7.3 10*3/uL (ref 3.8–10.8)

## 2022-09-30 LAB — COMPLETE METABOLIC PANEL WITH GFR
AG Ratio: 1.9 (calc) (ref 1.0–2.5)
ALT: 35 U/L (ref 9–46)
AST: 16 U/L (ref 10–40)
Albumin: 4.5 g/dL (ref 3.6–5.1)
Alkaline phosphatase (APISO): 49 U/L (ref 36–130)
BUN: 11 mg/dL (ref 7–25)
CO2: 27 mmol/L (ref 20–32)
Calcium: 10 mg/dL (ref 8.6–10.3)
Chloride: 103 mmol/L (ref 98–110)
Creat: 1.11 mg/dL (ref 0.60–1.26)
Globulin: 2.4 g/dL (calc) (ref 1.9–3.7)
Glucose, Bld: 136 mg/dL — ABNORMAL HIGH (ref 65–99)
Potassium: 4.4 mmol/L (ref 3.5–5.3)
Sodium: 141 mmol/L (ref 135–146)
Total Bilirubin: 0.5 mg/dL (ref 0.2–1.2)
Total Protein: 6.9 g/dL (ref 6.1–8.1)
eGFR: 88 mL/min/{1.73_m2} (ref >=60)

## 2022-09-30 LAB — LIPID PANEL
Cholesterol: 272 mg/dL — ABNORMAL HIGH (ref <200)
HDL: 36 mg/dL — ABNORMAL LOW (ref >=40)
Non-HDL Cholesterol (Calc): 236 mg/dL (calc) — ABNORMAL HIGH (ref <130)
Total CHOL/HDL Ratio: 7.6 (calc) — ABNORMAL HIGH (ref <5.0)
Triglycerides: 548 mg/dL — ABNORMAL HIGH (ref <150)

## 2022-09-30 LAB — HEMOGLOBIN A1C
Hgb A1c MFr Bld: 6.5 % of total Hgb — ABNORMAL HIGH (ref <5.7)
Mean Plasma Glucose: 140 mg/dL
eAG (mmol/L): 7.7 mmol/L

## 2022-09-30 LAB — TSH: TSH: 1.11 mIU/L (ref 0.40–4.50)

## 2022-10-03 ENCOUNTER — Encounter: Payer: Self-pay | Admitting: Family Medicine

## 2022-10-03 ENCOUNTER — Ambulatory Visit (INDEPENDENT_AMBULATORY_CARE_PROVIDER_SITE_OTHER): Payer: PRIVATE HEALTH INSURANCE | Admitting: Family Medicine

## 2022-10-03 ENCOUNTER — Other Ambulatory Visit: Payer: Self-pay | Admitting: Family Medicine

## 2022-10-03 VITALS — BP 134/86 | HR 81 | Temp 97.1°F | Ht 74.0 in | Wt 227.0 lb

## 2022-10-03 DIAGNOSIS — I10 Essential (primary) hypertension: Secondary | ICD-10-CM

## 2022-10-03 DIAGNOSIS — E559 Vitamin D deficiency, unspecified: Secondary | ICD-10-CM

## 2022-10-03 DIAGNOSIS — Z0000 Encounter for general adult medical examination without abnormal findings: Secondary | ICD-10-CM

## 2022-10-03 DIAGNOSIS — J4540 Moderate persistent asthma, uncomplicated: Secondary | ICD-10-CM

## 2022-10-03 DIAGNOSIS — E782 Mixed hyperlipidemia: Secondary | ICD-10-CM

## 2022-10-03 DIAGNOSIS — E538 Deficiency of other specified B group vitamins: Secondary | ICD-10-CM

## 2022-10-03 DIAGNOSIS — R7309 Other abnormal glucose: Secondary | ICD-10-CM

## 2022-10-03 DIAGNOSIS — E291 Testicular hypofunction: Secondary | ICD-10-CM

## 2022-10-03 MED ORDER — FLUTICASONE-SALMETEROL 250-50 MCG/ACT IN AEPB
1.0000 | INHALATION_SPRAY | Freq: Two times a day (BID) | RESPIRATORY_TRACT | 11 refills | Status: AC
Start: 2022-10-03 — End: ?

## 2022-10-03 MED ORDER — AMLODIPINE BESYLATE 5 MG PO TABS
2.5000 mg | ORAL_TABLET | Freq: Two times a day (BID) | ORAL | 3 refills | Status: DC
Start: 2022-10-03 — End: 2023-09-29

## 2022-10-03 NOTE — Progress Notes (Signed)
Subjective:    Patient ID: Jesse Blanchard, male    DOB: [birth year], 37 y.o.   MRN: [id number]  Jesse Blanchard is a 37 y.o. male presenting on 10/03/2022 for Hypertension   HPI  Here for Annual Physical and Lab Review  Moderate persistent asthma Uncontrolled, off maintenance Using Albuterol 2-3 times per day, usually in AM and Evening Multiple triggers allergies Symbicort worked but no longer covered Asking about long term albuterol use vs new inhaler   CHRONIC HYPERTENSION Previous Home readings 140-150s / higher 90s usually Last visit he was started on Amlodipinen 5mg  daily He had reaction with some discomfort and he did better with half dose twice a day He admits sometimes when stressed BP can be raised  Occasional alcohol, smokeless tobacco Current Meds - Amlodipine 2.5mg  twice a day (HALF of 5mg ) Denies CP, dyspnea, HA, edema, dizziness / lightheadedness   GAD with history of panic Update since last visit, he is doing well with anxiety management Continues Escitalopram 10mg  daily - Additionally taking Lorazepam for more acute stressors and situational, with good results 0.5mg  PRN dosing Rarely taking Lorazepam. Has refill No longer having panic attacks. - Denies recent depression mood or panic   Currently on  Worker's Comp, 15% limited disability Followed by Jaye Beagle  He is interested to relocate to other Ortho locally   Nicotine dependence Smokeless Tobacco Nicotine content 1 can smokeless tobacco per day Goal to taper down on tobacco  Elevated Glucose 136 on lab  Low Energy / Libido Check testosterone in future      10/03/2022    4:01 PM 07/09/2022    3:31 PM 06/26/2021    9:07 AM  Depression screen PHQ 2/9  Decreased Interest 1 0 0  Down, Depressed, Hopeless 0 0 0  PHQ - 2 Score 1 0 0  Altered sleeping 3 0 1  Tired, decreased energy 3 1 1   Change in appetite 1 0 1  Feeling bad or failure about yourself  0 0 0  Trouble concentrating 0 0 2   Moving slowly or fidgety/restless 0 0 0  Suicidal thoughts 0 0 0  PHQ-9 Score 8 1 5   Difficult doing work/chores Not difficult at all Not difficult at all Not difficult at all    Past Medical History:  Diagnosis Date   Asthma    Past Surgical History:  Procedure Laterality Date   SHOULDER SURGERY Left 07/2021   Social History   Socioeconomic History   Marital status: Married    Spouse name: Not on file   Number of children: Not on file   Years of education: High School   Highest education level: Not on file  Occupational History   Occupation: Production designer, theatre/television/film, [employer]    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[place])  Tobacco Use   Smoking status: Never   Smokeless tobacco: Current    Types: Chew   Tobacco comments:    1 can chewing tobacco daily, 15 years  Vaping Use   Vaping Use: Never used  Substance and Sexual Activity   Alcohol use: Yes    Comment: occasionally weekends   Drug use: No   Sexual activity: Not on file  Other Topics Concern   Not on file  Social History Narrative   Not on file   Social Determinants of Health   Financial Resource Strain: Not on file  Food Insecurity: Not on file  Transportation Needs: Not on file  Physical Activity: Not on file  Stress: Not  on file  Social Connections: Not on file  Intimate Partner Violence: Not on file   Family History  Problem Relation Age of Onset   Diabetes Mother    Heart disease Father    Diabetes Father    Heart attack Father 84       S/p CABG   Diabetes Sister    Hypertension Sister    Heart attack Maternal Grandmother 49   Cancer Maternal Grandfather        Undetermined, possible GI malignancy   Heart disease Paternal Grandfather    Heart attack Paternal Grandfather 23       repeat age 37 fatal   Diabetes Paternal Grandfather    Colon cancer Maternal Uncle    Diabetes Paternal Uncle    Prostate cancer Neg Hx    Current Outpatient Medications on File Prior to Visit  Medication Sig   albuterol (VENTOLIN  HFA) 108 (90 Base) MCG/ACT inhaler INHALE 2 PUFFS BY MOUTH EVERY 4 HOURS AS NEEDED FOR WHEEZE OR FOR SHORTNESS OF BREATH   escitalopram (LEXAPRO) 10 MG tablet Take 1 tablet (10 mg total) by mouth daily.   fluticasone (FLONASE) 50 MCG/ACT nasal spray Place 2 sprays into both nostrils daily. Use for 4-6 weeks then stop and use seasonally or as needed.   LORazepam (ATIVAN) 0.5 MG tablet TAKE 1 TABLET BY MOUTH EVERY DAY AS NEEDED FOR ANXIETY   montelukast (SINGULAIR) 10 MG tablet Take 1 tablet (10 mg total) by mouth at bedtime.   No current facility-administered medications on file prior to visit.    Review of Systems Per HPI unless specifically indicated above      Objective:    BP 134/86 (BP Location: Left Arm, Patient Position: Sitting, Cuff Size: Normal)   Pulse 81   Temp (!) 97.1 F (36.2 C) (Temporal)   Ht 6\' 2"  (1.88 m)   Wt 227 lb (103 kg)   SpO2 95%   BMI 29.15 kg/m   Wt Readings from Last 3 Encounters:  10/03/22 227 lb (103 kg)  07/09/22 234 lb 6.4 oz (106.3 kg)  06/26/21 231 lb 3.2 oz (104.9 kg)    Physical Exam Vitals and nursing note reviewed.  Constitutional:      General: He is not in acute distress.    Appearance: He is well-developed. He is not diaphoretic.     Comments: Well-appearing, comfortable, cooperative  HENT:     Head: Normocephalic and atraumatic.  Eyes:     General:        Right eye: No discharge.        Left eye: No discharge.     Conjunctiva/sclera: Conjunctivae normal.     Pupils: Pupils are equal, round, and reactive to light.  Neck:     Thyroid: No thyromegaly.  Cardiovascular:     Rate and Rhythm: Normal rate and regular rhythm.     Pulses: Normal pulses.     Heart sounds: Normal heart sounds. No murmur heard. Pulmonary:     Effort: Pulmonary effort is normal. No respiratory distress.     Breath sounds: Normal breath sounds. No wheezing or rales.  Abdominal:     General: Bowel sounds are normal. There is no distension.      Palpations: Abdomen is soft. There is no mass.     Tenderness: There is no abdominal tenderness.  Musculoskeletal:        General: No tenderness. Normal range of motion.     Cervical back: Normal range of  motion and neck supple.     Comments: Upper / Lower Extremities: - Normal muscle tone, strength bilateral upper extremities 5/5, lower extremities 5/5  Lymphadenopathy:     Cervical: No cervical adenopathy.  Skin:    General: Skin is warm and dry.     Findings: No erythema or rash.  Neurological:     Mental Status: He is alert and oriented to person, place, and time. Mental status is at baseline.     Comments: Distal sensation intact to light touch all extremities  Psychiatric:        Mood and Affect: Mood normal.        Behavior: Behavior normal.        Thought Content: Thought content normal.     Comments: Well groomed, good eye contact, normal speech and thoughts       Results for orders placed or performed in visit on 09/01/22  TSH  Result Value Ref Range   TSH 1.11 0.40 - 4.50 mIU/L  Hemoglobin A1c  Result Value Ref Range   Hgb A1c MFr Bld 6.5 (H) <5.7 % of total Hgb   Mean Plasma Glucose 140 mg/dL   eAG (mmol/L) 7.7 mmol/L  Lipid panel  Result Value Ref Range   Cholesterol 272 (H) <200 mg/dL   HDL 36 (L) > OR = 40 mg/dL   Triglycerides 161 (H) <150 mg/dL   LDL Cholesterol (Calc)  mg/dL (calc)   Total CHOL/HDL Ratio 7.6 (H) <5.0 (calc)   Non-HDL Cholesterol (Calc) 236 (H) <130 mg/dL (calc)  CBC with Differential/Platelet  Result Value Ref Range   WBC 7.3 3.8 - 10.8 Thousand/uL   RBC 5.03 4.20 - 5.80 Million/uL   Hemoglobin 15.7 13.2 - 17.1 g/dL   HCT 09.6 04.5 - 40.9 %   MCV 91.7 80.0 - 100.0 fL   MCH 31.2 27.0 - 33.0 pg   MCHC 34.1 32.0 - 36.0 g/dL   RDW 81.1 91.4 - 78.2 %   Platelets 267 140 - 400 Thousand/uL   MPV 10.5 7.5 - 12.5 fL   Neutro Abs 3,869 1,500 - 7,800 cells/uL   Lymphs Abs 1,913 850 - 3,900 cells/uL   Absolute Monocytes 496 200 - 950  cells/uL   Eosinophils Absolute 905 (H) 15 - 500 cells/uL   Basophils Absolute 117 0 - 200 cells/uL   Neutrophils Relative % 53 %   Total Lymphocyte 26.2 %   Monocytes Relative 6.8 %   Eosinophils Relative 12.4 %   Basophils Relative 1.6 %  COMPLETE METABOLIC PANEL WITH GFR  Result Value Ref Range   Glucose, Bld 136 (H) 65 - 99 mg/dL   BUN 11 7 - 25 mg/dL   Creat 9.56 2.13 - 0.86 mg/dL   eGFR 88 > OR = 60 VH/QIO/9.62X5   BUN/Creatinine Ratio SEE NOTE: 6 - 22 (calc)   Sodium 141 135 - 146 mmol/L   Potassium 4.4 3.5 - 5.3 mmol/L   Chloride 103 98 - 110 mmol/L   CO2 27 20 - 32 mmol/L   Calcium 10.0 8.6 - 10.3 mg/dL   Total Protein 6.9 6.1 - 8.1 g/dL   Albumin 4.5 3.6 - 5.1 g/dL   Globulin 2.4 1.9 - 3.7 g/dL (calc)   AG Ratio 1.9 1.0 - 2.5 (calc)   Total Bilirubin 0.5 0.2 - 1.2 mg/dL   Alkaline phosphatase (APISO) 49 36 - 130 U/L   AST 16 10 - 40 U/L   ALT 35 9 - 46 U/L  Assessment & Plan:   Problem List Items Addressed This Visit     Essential hypertension    Improved BP control on medication now Inadequate daily dose 5mg , split dose due to side effect  No known complications  Anxiety related   Plan:  Okay to take Amlodipine 2.5mg  TWICE A DAY - taking half of 5mg  dose twice a day, can re order if prefer Encourage improved lifestyle - low sodium diet, regular exercise Continue monitor BP outside office, bring readings to next visit, if persistently >140/90 or new symptoms notify office sooner       Relevant Medications   amLODipine (NORVASC) 5 MG tablet   Moderate persistent asthma without complication    Uncontrolled currently Daily use Albuterol inappropriate Improved on Singulair for asthma + allergy Previously on Symbicort but not covered Re order Advair      Relevant Medications   fluticasone-salmeterol (ADVAIR) 250-50 MCG/ACT AEPB   Other Visit Diagnoses     Annual physical exam    -  Primary       Updated Health Maintenance  information Reviewed recent lab results with patient Encouraged improvement to lifestyle with diet and exercise Goal of weight loss   Meds ordered this encounter  Medications   amLODipine (NORVASC) 5 MG tablet    Sig: Take 0.5 tablets (2.5 mg total) by mouth 2 (two) times daily.    Dispense:  90 tablet    Refill:  3   fluticasone-salmeterol (ADVAIR) 250-50 MCG/ACT AEPB    Sig: Inhale 1 puff into the lungs in the morning and at bedtime.    Dispense:  60 each    Refill:  11      Follow up plan: Return in about 4 months (around 02/02/2023) for 4 month fasting lab only then 1 week later Follow-up PreDM, Testosterone lab.  Future labs 02/13/23 Lipid, Vitamin D, B12, Testosterone, A1c next time  Saralyn Pilar, DO [facility] 10/03/2022, 3:04 PM

## 2022-10-03 NOTE — Patient Instructions (Addendum)
Thank you for coming to the office today.  Recent Labs    09/29/22 1412  HGBA1C 6.5*   Emphasis on lowering carb starch sugar.  You should 2 refills remaining on Lorazepam, let me know if cannot fill it  Testosterone, A1c repeat, Vitamin D Vitamin B12 and Lipid Cholesterol  ------------------------  If looking for 2nd opinion on the shoulder through the Worker's Comp.  Check into Dr Cassell Smiles or Dr Juanell Fairly ([facility]) for the next eval.   DUE for FASTING BLOOD WORK (no food or drink after midnight before the lab appointment, only water or coffee without cream/sugar on the morning of)  SCHEDULE "Lab Only" visit in the morning at the clinic for lab draw in 4 MONTHS   - Make sure Lab Only appointment is at about 1 week before your next appointment, so that results will be available  For Lab Results, once available within 2-3 days of blood draw, you can can log in to MyChart online to view your results and a brief explanation. Also, we can discuss results at next follow-up visit.    Please schedule a Follow-up Appointment to: Return in about 4 months (around 02/02/2023) for 4 month fasting lab only then 1 week later Follow-up PreDM, Testosterone lab.  If you have any other questions or concerns, please feel free to call the office or send a message through MyChart. You may also schedule an earlier appointment if necessary.  Additionally, you may be receiving a survey about your experience at our office within a few days to 1 week by e-mail or mail. We value your feedback.  Saralyn Pilar, DO [place], [place]

## 2022-10-04 NOTE — Assessment & Plan Note (Signed)
Uncontrolled currently Daily use Albuterol inappropriate Improved on Singulair for asthma + allergy Previously on Symbicort but not covered Re order Advair

## 2022-10-04 NOTE — Assessment & Plan Note (Signed)
Improved BP control on medication now Inadequate daily dose 5mg , split dose due to side effect  No known complications  Anxiety related   Plan:  Okay to take Amlodipine 2.5mg  TWICE A DAY - taking half of 5mg  dose twice a day, can re order if prefer Encourage improved lifestyle - low sodium diet, regular exercise Continue monitor BP outside office, bring readings to next visit, if persistently >140/90 or new symptoms notify office sooner

## 2022-10-13 ENCOUNTER — Other Ambulatory Visit: Payer: Self-pay | Admitting: Family Medicine

## 2022-10-13 DIAGNOSIS — J4540 Moderate persistent asthma, uncomplicated: Secondary | ICD-10-CM

## 2022-10-14 NOTE — Telephone Encounter (Signed)
Requested Prescriptions  Pending Prescriptions Disp Refills   albuterol (VENTOLIN HFA) 108 (90 Base) MCG/ACT inhaler [Pharmacy Med Name: ALBUTEROL HFA (PROAIR) INHALER] 8.5 each 0    Sig: INHALE 2 PUFFS BY MOUTH EVERY 4 HOURS AS NEEDED FOR WHEEZE OR FOR SHORTNESS OF BREATH     Pulmonology:  Beta Agonists 2 Passed - 10/13/2022 12:45 PM      Passed - Last BP in normal range    BP Readings from Last 1 Encounters:  10/03/22 134/86         Passed - Last Heart Rate in normal range    Pulse Readings from Last 1 Encounters:  10/03/22 81         Passed - Valid encounter within last 12 months    Recent Outpatient Visits           1 week ago Annual physical exam   [place] [facility] [place], Netta Neat, DO   3 months ago Generalized anxiety disorder with panic attacks   [place] [facility] [place], Netta Neat, DO   1 year ago Generalized anxiety disorder with panic attacks   [place] [facility] Althea Charon, Netta Neat, DO   2 years ago Annual physical exam   [place] [facility] [place], Netta Neat, DO   2 years ago Moderate persistent asthma without complication   [place] [facility] [place], Netta Neat, DO       Future Appointments             In 4 months Althea Charon, Netta Neat, DO [facility], [facility]

## 2022-11-10 ENCOUNTER — Other Ambulatory Visit: Payer: Self-pay | Admitting: Family Medicine

## 2022-11-10 DIAGNOSIS — J4540 Moderate persistent asthma, uncomplicated: Secondary | ICD-10-CM

## 2022-11-11 NOTE — Telephone Encounter (Signed)
Requested Prescriptions  Pending Prescriptions Disp Refills   albuterol (VENTOLIN HFA) 108 (90 Base) MCG/ACT inhaler [Pharmacy Med Name: ALBUTEROL HFA (PROAIR) INHALER] 8.5 each 0    Sig: INHALE 2 PUFFS BY MOUTH EVERY 4 HOURS AS NEEDED FOR WHEEZE OR FOR SHORTNESS OF BREATH     Pulmonology:  Beta Agonists 2 Passed - 11/10/2022  2:22 AM      Passed - Last BP in normal range    BP Readings from Last 1 Encounters:  10/03/22 134/86         Passed - Last Heart Rate in normal range    Pulse Readings from Last 1 Encounters:  10/03/22 81         Passed - Valid encounter within last 12 months    Recent Outpatient Visits           1 month ago Annual physical exam   [place] [facility] [place], Netta Neat, DO   4 months ago Generalized anxiety disorder with panic attacks   [place] [facility] [place], Netta Neat, DO   1 year ago Generalized anxiety disorder with panic attacks   [place] [facility] Al[place] Charon, Netta Neat, DO   2 years ago Annual physical exam   [place] [facility] [place], Netta Neat, DO   2 years ago Moderate persistent asthma without complication   [place] [facility] [place], Netta Neat, DO       Future Appointments             In 3 months Althea Charon, Netta Neat, DO [place] [facility], [facility]

## 2022-12-02 ENCOUNTER — Other Ambulatory Visit: Payer: Self-pay | Admitting: Family Medicine

## 2022-12-02 DIAGNOSIS — J4540 Moderate persistent asthma, uncomplicated: Secondary | ICD-10-CM

## 2022-12-16 ENCOUNTER — Telehealth: Payer: Self-pay | Admitting: Family Medicine

## 2022-12-16 ENCOUNTER — Other Ambulatory Visit: Payer: Self-pay | Admitting: Family Medicine

## 2022-12-16 ENCOUNTER — Other Ambulatory Visit: Payer: Self-pay

## 2022-12-16 DIAGNOSIS — J4540 Moderate persistent asthma, uncomplicated: Secondary | ICD-10-CM

## 2022-12-16 NOTE — Telephone Encounter (Signed)
Pt's wife is calling in because the pharmacy said pt doesn't have any refills left on albuterol (VENTOLIN HFA) 108 (90 Base) MCG/ACT inhaler [[id number]] . Lillia Abed would like to know if a new prescription be sent over. Please advise.

## 2022-12-16 NOTE — Telephone Encounter (Signed)
Refill sent ? ?KP ?

## 2023-02-10 ENCOUNTER — Other Ambulatory Visit: Payer: Self-pay | Admitting: Family Medicine

## 2023-02-10 DIAGNOSIS — J4540 Moderate persistent asthma, uncomplicated: Secondary | ICD-10-CM

## 2023-02-11 NOTE — Telephone Encounter (Signed)
Requested Prescriptions  Pending Prescriptions Disp Refills   albuterol (VENTOLIN HFA) 108 (90 Base) MCG/ACT inhaler [Pharmacy Med Name: ALBUTEROL HFA (PROAIR) INHALER] 8.5 each 0    Sig: INHALE 2 PUFFS BY MOUTH EVERY 4 HOURS AS NEEDED FOR WHEEZE OR FOR SHORTNESS OF BREATH     Pulmonology:  Beta Agonists 2 Passed - 02/10/2023  1:32 AM      Passed - Last BP in normal range    BP Readings from Last 1 Encounters:  10/03/22 134/86         Passed - Last Heart Rate in normal range    Pulse Readings from Last 1 Encounters:  10/03/22 81         Passed - Valid encounter within last 12 months    Recent Outpatient Visits           4 months ago Annual physical exam   [place] [facility] [place], Netta Neat, DO   7 months ago Generalized anxiety disorder with panic attacks   [place] [facility] [place], Netta Neat, DO   1 year ago Generalized anxiety disorder with panic attacks   [place] [facility] Althea Charon, Netta Neat, DO   2 years ago Annual physical exam   [place] [facility] [place], Netta Neat, DO   2 years ago Moderate persistent asthma without complication   [place] [facility] [place], Netta Neat, [place]

## 2023-02-13 ENCOUNTER — Other Ambulatory Visit: Payer: PRIVATE HEALTH INSURANCE

## 2023-02-19 ENCOUNTER — Other Ambulatory Visit: Payer: Self-pay | Admitting: Family Medicine

## 2023-02-19 DIAGNOSIS — F410 Panic disorder [episodic paroxysmal anxiety] without agoraphobia: Secondary | ICD-10-CM

## 2023-02-20 ENCOUNTER — Ambulatory Visit: Payer: PRIVATE HEALTH INSURANCE | Admitting: Family Medicine

## 2023-02-20 NOTE — Telephone Encounter (Signed)
Requested medication (s) are due for refill today -yes  Requested medication (s) are on the active medication list -yes  Future visit scheduled -no  Last refill: 07/09/22 #10 3RF  Notes to clinic:  non delegated Rx  Requested Prescriptions  Pending Prescriptions Disp Refills   LORazepam (ATIVAN) 0.5 MG tablet [Pharmacy Med Name: LORAZEPAM 0.5 MG TABLET] 10 tablet     Sig: TAKE 1 TABLET BY MOUTH EVERY DAY AS NEEDED FOR ANXIETY     Not Delegated - Psychiatry: Anxiolytics/Hypnotics 2 Failed - 02/19/2023 11:56 PM      Failed - This refill cannot be delegated      Failed - Urine Drug Screen completed in last 360 days      Passed - Patient is not pregnant      Passed - Valid encounter within last 6 months    Recent Outpatient Visits           4 months ago Annual physical exam   [place] [facility] [place], Netta Neat, DO   7 months ago Generalized anxiety disorder with panic attacks   [place] [facility] [place], Netta Neat, DO   1 year ago Generalized anxiety disorder with panic attacks   [place] [facility] [place], Netta Neat, DO   2 years ago Annual physical exam   [place] [facility] [place], Netta Neat, DO   2 years ago Moderate persistent asthma without complication   [place] [facility] Smitty Cords, DO                 Requested Prescriptions  Pending Prescriptions Disp Refills   LORazepam (ATIVAN) 0.5 MG tablet [Pharmacy Med Name: LORAZEPAM 0.5 MG TABLET] 10 tablet     Sig: TAKE 1 TABLET BY MOUTH EVERY DAY AS NEEDED FOR ANXIETY     Not Delegated - Psychiatry: Anxiolytics/Hypnotics 2 Failed - 02/19/2023 11:56 PM      Failed - This refill cannot be delegated      Failed - Urine Drug Screen completed in last 360 days      Passed - Patient is not pregnant      Passed - Valid encounter within last 6 months    Recent Outpatient  Visits           4 months ago Annual physical exam   [place] [facility] [place], Netta Neat, DO   7 months ago Generalized anxiety disorder with panic attacks   [place] [facility] [place], Netta Neat, DO   1 year ago Generalized anxiety disorder with panic attacks   [place] [facility] Althea Charon, Netta Neat, DO   2 years ago Annual physical exam   [place] [facility] [place], Netta Neat, DO   2 years ago Moderate persistent asthma without complication   [facility] [place], Netta Neat, [place]

## 2023-03-24 ENCOUNTER — Other Ambulatory Visit: Payer: Self-pay | Admitting: Family Medicine

## 2023-03-24 DIAGNOSIS — J4540 Moderate persistent asthma, uncomplicated: Secondary | ICD-10-CM

## 2023-03-26 NOTE — Telephone Encounter (Signed)
Requested Prescriptions  Pending Prescriptions Disp Refills   albuterol (VENTOLIN HFA) 108 (90 Base) MCG/ACT inhaler [Pharmacy Med Name: ALBUTEROL HFA (PROAIR) INHALER] 8.5 each 0    Sig: INHALE 2 PUFFS BY MOUTH EVERY 4 HOURS AS NEEDED FOR WHEEZE OR FOR SHORTNESS OF BREATH     Pulmonology:  Beta Agonists 2 Passed - 03/24/2023  1:29 AM      Passed - Last BP in normal range    BP Readings from Last 1 Encounters:  10/03/22 134/86         Passed - Last Heart Rate in normal range    Pulse Readings from Last 1 Encounters:  10/03/22 81         Passed - Valid encounter within last 12 months    Recent Outpatient Visits           5 months ago Annual physical exam   [place] [facility] [place], Netta Neat, DO   8 months ago Generalized anxiety disorder with panic attacks   [place] [facility] [place], Netta Neat, DO   1 year ago Generalized anxiety disorder with panic attacks   [place] [facility] Althe[place] Charon, Netta Neat, DO   2 years ago Annual physical exam   [place] [facility] [place], Netta Neat, DO   2 years ago Moderate persistent asthma without complication    [facility] [place], Netta Neat, [place]

## 2023-04-22 ENCOUNTER — Other Ambulatory Visit: Payer: Self-pay | Admitting: Family Medicine

## 2023-04-22 DIAGNOSIS — J4540 Moderate persistent asthma, uncomplicated: Secondary | ICD-10-CM

## 2023-04-25 NOTE — Telephone Encounter (Signed)
 Requested Prescriptions  Pending Prescriptions Disp Refills   albuterol  (VENTOLIN  HFA) 108 (90 Base) MCG/ACT inhaler [Pharmacy Med Name: ALBUTEROL  HFA (PROAIR ) INHALER] 8.5 each 1    Sig: Inhale 2 puffs into the lungs every 4 (four) hours as needed for wheezing or shortness of breath. OFFICE VISIT NEEDED FOR ADDITIONAL REFILLS     Pulmonology:  Beta Agonists 2 Passed - 04/25/2023 12:17 PM      Passed - Last BP in normal range    BP Readings from Last 1 Encounters:  10/03/22 134/86         Passed - Last Heart Rate in normal range    Pulse Readings from Last 1 Encounters:  10/03/22 81         Passed - Valid encounter within last 12 months    Recent Outpatient Visits           6 months ago Annual physical exam   [place] [facility] [place], Marsa PARAS, DO   9 months ago Generalized anxiety disorder with panic attacks   [place] [facility] [place], Marsa PARAS, DO   1 year ago Generalized anxiety disorder with panic attacks   [place] [facility] [place], Marsa PARAS, DO   2 years ago Annual physical exam   [place] [facility] [place], Marsa PARAS, DO   2 years ago Moderate persistent asthma without complication   [place] [facility] [place], Marsa PARAS, [place]

## 2023-05-19 ENCOUNTER — Other Ambulatory Visit: Payer: Self-pay | Admitting: Family Medicine

## 2023-05-19 DIAGNOSIS — F410 Panic disorder [episodic paroxysmal anxiety] without agoraphobia: Secondary | ICD-10-CM

## 2023-05-21 NOTE — Telephone Encounter (Signed)
Requested medication (s) are due for refill today: Yes  Requested medication (s) are on the active medication list: Yes  Last refill:  02/20/23  Future visit scheduled: No  Notes to clinic:  Not delegated.    Requested Prescriptions  Pending Prescriptions Disp Refills   LORazepam (ATIVAN) 0.5 MG tablet [Pharmacy Med Name: LORAZEPAM 0.5 MG TABLET] 10 tablet 2    Sig: TAKE 1 TABLET BY MOUTH EVERY DAY AS NEEDED FOR ANXIETY     Not Delegated - Psychiatry: Anxiolytics/Hypnotics 2 Failed - 05/21/2023 11:29 AM      Failed - This refill cannot be delegated      Failed - Urine Drug Screen completed in last 360 days      Failed - Valid encounter within last 6 months    Recent Outpatient Visits           7 months ago Annual physical exam   [place] [facility] [place], Netta Neat, DO   10 months ago Generalized anxiety disorder with panic attacks   [place] [facility] [place], Netta Neat, DO   1 year ago Generalized anxiety disorder with panic attacks   [place] [facility] Smitty Cords, DO   2 years ago Annual physical exam   [place] [facility] [place], Netta Neat, DO   2 years ago Moderate persistent asthma without complication   [place] [facility] [place], Netta Neat, [place] - Patient is not pregnant

## 2023-06-04 ENCOUNTER — Ambulatory Visit: Payer: No Typology Code available for payment source | Admitting: Internal Medicine

## 2023-07-09 ENCOUNTER — Other Ambulatory Visit: Payer: Self-pay | Admitting: Family Medicine

## 2023-07-09 DIAGNOSIS — J4540 Moderate persistent asthma, uncomplicated: Secondary | ICD-10-CM

## 2023-07-09 NOTE — Telephone Encounter (Signed)
 Requested medication (s) are due for refill today: yes  Requested medication (s) are on the active medication list: yes  Last refill:  04/25/23 #8.5/1  Future visit scheduled: no  Notes to clinic:  courtesy refill given, pt needing appt      Requested Prescriptions  Pending Prescriptions Disp Refills   albuterol (VENTOLIN HFA) 108 (90 Base) MCG/ACT inhaler [Pharmacy Med Name: ALBUTEROL HFA (VENTOLIN) INH] 18 each 1    Sig: Inhale 2 puffs into the lungs every 4 (four) hours as needed for wheezing or shortness of breath. OFFICE VISIT NEEDED FOR ADDITIONAL REFILLS     Pulmonology:  Beta Agonists 2 Passed - 07/09/2023  5:25 PM      Passed - Last BP in normal range    BP Readings from Last 1 Encounters:  10/03/22 134/86         Passed - Last Heart Rate in normal range    Pulse Readings from Last 1 Encounters:  10/03/22 81         Passed - Valid encounter within last 12 months    Recent Outpatient Visits           9 months ago Annual physical exam   [facility] [place], Netta Neat, DO   1 year ago Generalized anxiety disorder with panic attacks   [facility] [place], Netta Neat, DO   2 years ago Generalized anxiety disorder with panic attacks   [facility] [place], Netta Neat, DO   2 years ago Annual physical exam   [place] [place], Netta Neat, DO   2 years ago Moderate persistent asthma without complication   [place] [facility] [place], Netta Neat, [place]

## 2023-07-10 NOTE — Telephone Encounter (Signed)
 Left message for patient to return call OK to schedule annual physical when he returns call

## 2023-08-31 ENCOUNTER — Other Ambulatory Visit: Payer: Self-pay | Admitting: Family Medicine

## 2023-08-31 DIAGNOSIS — I10 Essential (primary) hypertension: Secondary | ICD-10-CM

## 2023-09-02 NOTE — Telephone Encounter (Signed)
 Rx 10/03/22 #90 3RF- too soon- needs appointment Requested Prescriptions  Pending Prescriptions Disp Refills   amLODipine  (NORVASC ) 5 MG tablet [Pharmacy Med Name: AMLODIPINE  BESYLATE 5 MG TAB] 90 tablet 3    Sig: TAKE 0.5 TABLETS BY MOUTH 2 TIMES DAILY.     Cardiovascular: Calcium Channel Blockers 2 Failed - 09/02/2023  1:23 PM      Failed - Valid encounter within last 6 months    Recent Outpatient Visits   None            Passed - Last BP in normal range    BP Readings from Last 1 Encounters:  10/03/22 134/86         Passed - Last Heart Rate in normal range    Pulse Readings from Last 1 Encounters:  10/03/22 81

## 2023-09-03 ENCOUNTER — Other Ambulatory Visit: Payer: Self-pay | Admitting: Family Medicine

## 2023-09-03 DIAGNOSIS — I10 Essential (primary) hypertension: Secondary | ICD-10-CM

## 2023-09-07 NOTE — Telephone Encounter (Signed)
 Duplicate request, OV needed.  Requested Prescriptions  Pending Prescriptions Disp Refills   amLODipine  (NORVASC ) 5 MG tablet [Pharmacy Med Name: AMLODIPINE  BESYLATE 5 MG TAB] 90 tablet 3    Sig: TAKE 0.5 TABLETS BY MOUTH 2 TIMES DAILY.     Cardiovascular: Calcium Channel Blockers 2 Failed - 09/07/2023 12:22 PM      Failed - Valid encounter within last 6 months    Recent Outpatient Visits   None            Passed - Last BP in normal range    BP Readings from Last 1 Encounters:  10/03/22 134/86         Passed - Last Heart Rate in normal range    Pulse Readings from Last 1 Encounters:  10/03/22 81

## 2023-09-16 ENCOUNTER — Emergency Department

## 2023-09-16 ENCOUNTER — Other Ambulatory Visit: Payer: Self-pay

## 2023-09-16 ENCOUNTER — Emergency Department
Admission: EM | Admit: 2023-09-16 | Discharge: 2023-09-16 | Disposition: A | Discharge status: Home / Self Care | Attending: Emergency Medicine | Admitting: Emergency Medicine

## 2023-09-16 ENCOUNTER — Encounter: Payer: Self-pay | Admitting: Emergency Medicine

## 2023-09-16 DIAGNOSIS — I10 Essential (primary) hypertension: Secondary | ICD-10-CM | POA: Diagnosis not present

## 2023-09-16 DIAGNOSIS — J45909 Unspecified asthma, uncomplicated: Secondary | ICD-10-CM | POA: Insufficient documentation

## 2023-09-16 DIAGNOSIS — R079 Chest pain, unspecified: Secondary | ICD-10-CM

## 2023-09-16 DIAGNOSIS — R0789 Other chest pain: Secondary | ICD-10-CM | POA: Insufficient documentation

## 2023-09-16 LAB — HEPATIC FUNCTION PANEL
ALT: 40 U/L (ref 0–44)
AST: 19 U/L (ref 15–41)
Albumin: 4.4 g/dL (ref 3.5–5.0)
Alkaline Phosphatase: 44 U/L (ref 38–126)
Bilirubin, Direct: <0.1 mg/dL (ref 0.0–0.2)
Total Bilirubin: 1 mg/dL (ref 0.0–1.2)
Total Protein: 7.1 g/dL (ref 6.5–8.1)

## 2023-09-16 LAB — BASIC METABOLIC PANEL WITH GFR
Anion gap: 9 (ref 5–15)
BUN: 12 mg/dL (ref 6–20)
CO2: 26 mmol/L (ref 22–32)
Calcium: 9.9 mg/dL (ref 8.9–10.3)
Chloride: 104 mmol/L (ref 98–111)
Creatinine, Ser: 1.09 mg/dL (ref 0.61–1.24)
GFR, Estimated: >60 mL/min (ref >60)
Glucose, Bld: 100 mg/dL — ABNORMAL HIGH (ref 70–99)
Potassium: 4 mmol/L (ref 3.5–5.1)
Sodium: 139 mmol/L (ref 135–145)

## 2023-09-16 LAB — CBC
HCT: 45.6 % (ref 39.0–52.0)
Hemoglobin: 15.7 g/dL (ref 13.0–17.0)
MCH: 31 pg (ref 26.0–34.0)
MCHC: 34.4 g/dL (ref 30.0–36.0)
MCV: 89.9 fL (ref 80.0–100.0)
Platelets: 262 10*3/uL (ref 150–400)
RBC: 5.07 MIL/uL (ref 4.22–5.81)
RDW: 12.4 % (ref 11.5–15.5)
WBC: 8.2 10*3/uL (ref 4.0–10.5)
nRBC: 0 % (ref 0.0–0.2)

## 2023-09-16 LAB — TROPONIN I (HIGH SENSITIVITY): Troponin I (High Sensitivity): 6 ng/L (ref <18)

## 2023-09-16 MED ORDER — NAPROXEN 500 MG PO TABS: 500.0000 mg | ORAL_TABLET | Freq: Two times a day (BID) | ORAL | 0 refills | Status: DC

## 2023-09-16 NOTE — ED Triage Notes (Signed)
 Patient to ED via POV for mid/left sided CP. Ongoing x1 week. States it has woken him up out of his sleep a few times. Today also having some nausea. Denies cardiac hx.

## 2023-09-16 NOTE — ED Provider Notes (Signed)
 [facility] Provider Note    Event Date/Time   First MD Initiated Contact with Patient 09/16/23 1724     (approximate)   History   Chest Pain   HPI  Vladislav Axelson is a 38 y.o. male with history of relief hypertension, asthma, anxiety with panic attacks and as listed in EMR presents to the emergency department for treatment and evaluation of mid to left-sided chest pain that has been intermittent for the past week.  It has awaken him from sleep a few times.  Pain is a sharp stabbing pain in the left lower anterior rib area.  No shortness of breath, recent URI, or fever.  No history of CAD.  Pain does not increase with movement or deep breath.  No history of DVT/PE.Aaron Aas      Physical Exam   Triage Vital Signs: ED Triage Vitals  Encounter Vitals Group     BP 09/16/23 1649 (!) 139/92     Systolic BP Percentile --      Diastolic BP Percentile --      Pulse Rate 09/16/23 1649 84     Resp 09/16/23 1649 17     Temp 09/16/23 1649 98.3 F (36.8 C)     Temp Source 09/16/23 1649 Oral     SpO2 09/16/23 1649 97 %     Weight 09/16/23 1648 215 lb (97.5 kg)     Height 09/16/23 1648 6\' 2"  (1.88 m)     Head Circumference --      Peak Flow --      Pain Score 09/16/23 1648 4     Pain Loc --      Pain Education --      Exclude from Growth Chart --     Most recent vital signs: Vitals:   09/16/23 1649  BP: (!) 139/92  Pulse: 84  Resp: 17  Temp: 98.3 F (36.8 C)  SpO2: 97%    General: Awake, no distress.  CV:  Good peripheral perfusion.  No murmurs, gallops, or rubs noted on auscultation Resp:  Normal effort.  Abd:  No distention.  Other:     ED Results / Procedures / Treatments   Labs (all labs ordered are listed, but only abnormal results are displayed) Labs Reviewed  BASIC METABOLIC PANEL WITH GFR - Abnormal; Notable for the following components:      Result Value   Glucose, Bld 100 (*)    All other components within normal limits  CBC   HEPATIC FUNCTION PANEL  TROPONIN I (HIGH SENSITIVITY)     EKG  Normal sinus rhythm with a ventricular rate of 83   RADIOLOGY  Image and radiology report reviewed and interpreted by me. Radiology report consistent with the same.  Chest x-ray negative for acute cardiopulmonary abnormality.  PROCEDURES:  Critical Care performed: No  Procedures   MEDICATIONS ORDERED IN ED:  Medications - No data to display   IMPRESSION / MDM / ASSESSMENT AND PLAN / ED COURSE   I have reviewed the triage note.  Differential diagnosis includes, but is not limited to, muscle strain, costochondritis, cardiac event, angina  Patient's presentation is most consistent with acute illness / injury with system symptoms.  38 year old male presenting to the emergency department for treatment and evaluation of intermittent, sharp left side chest wall pain.  See HPI for further details.  Vital signs are reassuring.  Chest x-ray is without acute concerns.  EKG is normal.  Results discussed with the patient.  Plan will be to have him take Naprosyn  twice per day.  He will also be given referral to cardiology for follow-up to further rule out cardiac etiology.  He was encouraged to return to the emergency department for symptoms of change or worsen if he is unable to see his primary care provider.      FINAL CLINICAL IMPRESSION(S) / ED DIAGNOSES   Final diagnoses:  None     Rx / DC Orders   ED Discharge Orders     None        Note:  This document was prepared using Dragon voice recognition software and may include unintentional dictation errors.   Sherryle Don, FNP 09/16/23 1610    Viviano Ground, MD 09/21/23 [phone]

## 2023-09-27 ENCOUNTER — Other Ambulatory Visit: Payer: Self-pay | Admitting: Family Medicine

## 2023-09-27 DIAGNOSIS — F411 Generalized anxiety disorder: Secondary | ICD-10-CM

## 2023-09-27 DIAGNOSIS — F410 Panic disorder [episodic paroxysmal anxiety] without agoraphobia: Secondary | ICD-10-CM

## 2023-09-27 DIAGNOSIS — I10 Essential (primary) hypertension: Secondary | ICD-10-CM

## 2023-09-28 ENCOUNTER — Telehealth: Payer: Self-pay

## 2023-09-28 ENCOUNTER — Ambulatory Visit: Payer: Self-pay

## 2023-09-28 ENCOUNTER — Other Ambulatory Visit: Payer: Self-pay | Admitting: Family Medicine

## 2023-09-28 DIAGNOSIS — F410 Panic disorder [episodic paroxysmal anxiety] without agoraphobia: Secondary | ICD-10-CM

## 2023-09-28 DIAGNOSIS — I10 Essential (primary) hypertension: Secondary | ICD-10-CM

## 2023-09-28 NOTE — Telephone Encounter (Signed)
 FYI Only or Action Required?: Action required by provider  Patient was last seen in primary care on 10/03/2022 by Raina Bunting, DO. Called Nurse Triage reporting Hypertension. Symptoms began yesterday. Interventions attempted: Nothing. Symptoms are: stable.  Triage Disposition: Call PCP Within 24 Hours  Patient/caregiver understands and will follow disposition?: YesReason for Disposition  Ran out of BP medications  Answer Assessment - Initial Assessment Questions 1. BLOOD PRESSURE: "What is the blood pressure?" "Did you take at least two measurements 5 minutes apart?"     No, concerned about being out of 2. ONSET: "When did you take your blood pressure?"     Maybe yesterday, does not know measurement 3. HOW: "How did you take your blood pressure?" (e.g., automatic home BP monitor, visiting nurse)     Automatic cuff at home 4. HISTORY: "Do you have a history of high blood pressure?"     yes 5. MEDICINES: "Are you taking any medicines for blood pressure?" "Have you missed any doses recently?"     Patient is out of blood pressure meds 6. OTHER SYMPTOMS: "Do you have any symptoms?" (e.g., blurred vision, chest pain, difficulty breathing, headache, weakness)     Wife reports that last night he was having chest pain and that his heart was coming out of his chest. Right now he feels stressed out   Patient is out of his 5mg  amlodipine  and needs refill  Protocols used: Blood Pressure - High-A-AH

## 2023-09-28 NOTE — Telephone Encounter (Signed)
 Jesse Blanchard, pt's wife called to report that he is completely out.

## 2023-09-28 NOTE — Telephone Encounter (Signed)
 Copied from CRM [id number]. Topic: Clinical - Medication Refill >> Sep 28, 2023  5:33 PM Felizardo Hotter wrote: Pt is scheduled on 10/07/2023 with Dr. Romeo Co, [facility] [place].

## 2023-09-28 NOTE — Telephone Encounter (Unsigned)
 Copied from CRM [id number]. Topic: Clinical - Medication Refill >> Sep 28, 2023 10:25 AM Juleen Oakland F wrote: Medication: amLODipine  (NORVASC ) 5 MG tablet  LORazepam  (ATIVAN ) 0.5 MG tablet     Has the patient contacted their pharmacy? Yes (Agent: If no, request that the patient contact the pharmacy for the refill. If patient does not wish to contact the pharmacy document the reason why and proceed with request.) (Agent: If yes, when and what did the pharmacy advise?)  This is the patient's preferred pharmacy:  [id number] - [place], [place] - [address] [place] [address] Phone: [phone] Fax: [phone]  Is this the correct pharmacy for this prescription? Yes If no, delete pharmacy and type the correct one.   Has the prescription been filled recently? Yes  Is the patient out of the medication? Yes  Has the patient been seen for an appointment in the last year OR does the patient have an upcoming appointment? Yes  Can we respond through MyChart? No  Agent: Please be advised that Rx refills may take up to 3 business days. We ask that you follow-up with your pharmacy.

## 2023-09-29 ENCOUNTER — Other Ambulatory Visit: Payer: Self-pay

## 2023-09-29 DIAGNOSIS — I10 Essential (primary) hypertension: Secondary | ICD-10-CM

## 2023-09-29 MED ORDER — AMLODIPINE BESYLATE 5 MG PO TABS
2.5000 mg | ORAL_TABLET | Freq: Two times a day (BID) | ORAL | 0 refills | Status: AC
Start: 2023-09-29 — End: ?

## 2023-09-29 MED ORDER — LORAZEPAM 0.5 MG PO TABS: ORAL_TABLET | ORAL | 2 refills | Status: DC

## 2023-09-29 NOTE — Telephone Encounter (Signed)
 Requested medication (s) are due for refill today: yes  Requested medication (s) are on the active medication list: yes  Last refill:  05/21/23  Future visit scheduled: yes  Notes to clinic:  Unable to refill per protocol, cannot delegate.      Requested Prescriptions  Pending Prescriptions Disp Refills   LORazepam  (ATIVAN ) 0.5 MG tablet [Pharmacy Med Name: LORAZEPAM  0.5 MG TABLET] 10 tablet     Sig: TAKE 1 TABLET BY MOUTH EVERY DAY AS NEEDED FOR ANXIETY     Not Delegated - Psychiatry: Anxiolytics/Hypnotics 2 Failed - 09/29/2023  8:18 AM      Failed - This refill cannot be delegated      Failed - Urine Drug Screen completed in last 360 days      Failed - Valid encounter within last 6 months    Recent Outpatient Visits   None            Passed - Patient is not pregnant      Refused Prescriptions Disp Refills   amLODipine  (NORVASC ) 5 MG tablet [Pharmacy Med Name: AMLODIPINE  BESYLATE 5 MG TAB] 90 tablet     Sig: TAKE 0.5 TABLETS BY MOUTH 2 TIMES DAILY.     Cardiovascular: Calcium Channel Blockers 2 Failed - 09/29/2023  8:18 AM      Failed - Valid encounter within last 6 months    Recent Outpatient Visits   None            Passed - Last BP in normal range    BP Readings from Last 1 Encounters:  09/16/23 138/89         Passed - Last Heart Rate in normal range    Pulse Readings from Last 1 Encounters:  09/16/23 82

## 2023-09-29 NOTE — Telephone Encounter (Signed)
 Requested medication (s) are due for refill today: yes  Requested medication (s) are on the active medication list: yes  Last refill:  05/21/23 #10 2 RF  Future visit scheduled: yes  Notes to clinic:  med not delegated to NT to RF   Requested Prescriptions  Pending Prescriptions Disp Refills   LORazepam  (ATIVAN ) 0.5 MG tablet [Pharmacy Med Name: LORAZEPAM  0.5 MG TABLET] 10 tablet     Sig: TAKE 1 TABLET BY MOUTH EVERY DAY AS NEEDED FOR ANXIETY     Not Delegated - Psychiatry: Anxiolytics/Hypnotics 2 Failed - 09/29/2023  8:14 AM      Failed - This refill cannot be delegated      Failed - Urine Drug Screen completed in last 360 days      Failed - Valid encounter within last 6 months    Recent Outpatient Visits   None            Passed - Patient is not pregnant      Refused Prescriptions Disp Refills   amLODipine  (NORVASC ) 5 MG tablet [Pharmacy Med Name: AMLODIPINE  BESYLATE 5 MG TAB] 90 tablet     Sig: TAKE 0.5 TABLETS BY MOUTH 2 TIMES DAILY.     Cardiovascular: Calcium Channel Blockers 2 Failed - 09/29/2023  8:14 AM      Failed - Valid encounter within last 6 months    Recent Outpatient Visits   None            Passed - Last BP in normal range    BP Readings from Last 1 Encounters:  09/16/23 138/89         Passed - Last Heart Rate in normal range    Pulse Readings from Last 1 Encounters:  09/16/23 82

## 2023-09-29 NOTE — Telephone Encounter (Signed)
 Requested Prescriptions  Pending Prescriptions Disp Refills   LORazepam  (ATIVAN ) 0.5 MG tablet [Pharmacy Med Name: LORAZEPAM  0.5 MG TABLET] 10 tablet     Sig: TAKE 1 TABLET BY MOUTH EVERY DAY AS NEEDED FOR ANXIETY     Not Delegated - Psychiatry: Anxiolytics/Hypnotics 2 Failed - 09/29/2023  8:13 AM      Failed - This refill cannot be delegated      Failed - Urine Drug Screen completed in last 360 days      Failed - Valid encounter within last 6 months    Recent Outpatient Visits   None            Passed - Patient is not pregnant      Refused Prescriptions Disp Refills   amLODipine  (NORVASC ) 5 MG tablet [Pharmacy Med Name: AMLODIPINE  BESYLATE 5 MG TAB] 90 tablet     Sig: TAKE 0.5 TABLETS BY MOUTH 2 TIMES DAILY.     Cardiovascular: Calcium Channel Blockers 2 Failed - 09/29/2023  8:13 AM      Failed - Valid encounter within last 6 months    Recent Outpatient Visits   None            Passed - Last BP in normal range    BP Readings from Last 1 Encounters:  09/16/23 138/89         Passed - Last Heart Rate in normal range    Pulse Readings from Last 1 Encounters:  09/16/23 82

## 2023-09-29 NOTE — Telephone Encounter (Signed)
 Requested medication (s) are due for refill today: na   Requested medication (s) are on the active medication list: yes   Last refill:  05/21/23 #10 2 refills  Future visit scheduled: no  Notes to clinic:   not delegated per protocol. Do you want to refill Rx?     Requested Prescriptions  Pending Prescriptions Disp Refills   LORazepam  (ATIVAN ) 0.5 MG tablet 10 tablet 2    Sig: TAKE 1 TABLET BY MOUTH EVERY DAY AS NEEDED FOR ANXIETY     Not Delegated - Psychiatry: Anxiolytics/Hypnotics 2 Failed - 09/29/2023 11:30 AM      Failed - This refill cannot be delegated      Failed - Urine Drug Screen completed in last 360 days      Failed - Valid encounter within last 6 months    Recent Outpatient Visits   None            Passed - Patient is not pregnant      Refused Prescriptions Disp Refills   amLODipine  (NORVASC ) 5 MG tablet 90 tablet 3    Sig: Take 0.5 tablets (2.5 mg total) by mouth 2 (two) times daily.     Cardiovascular: Calcium Channel Blockers 2 Failed - 09/29/2023 11:30 AM      Failed - Valid encounter within last 6 months    Recent Outpatient Visits   None            Passed - Last BP in normal range    BP Readings from Last 1 Encounters:  09/16/23 138/89         Passed - Last Heart Rate in normal range    Pulse Readings from Last 1 Encounters:  09/16/23 82

## 2023-09-29 NOTE — Telephone Encounter (Signed)
 Requested by patient . Medication request already signed today 09/29/23 at 7:57 am. Duplicate request.  Requested Prescriptions  Pending Prescriptions Disp Refills   LORazepam  (ATIVAN ) 0.5 MG tablet 10 tablet 2    Sig: TAKE 1 TABLET BY MOUTH EVERY DAY AS NEEDED FOR ANXIETY     Not Delegated - Psychiatry: Anxiolytics/Hypnotics 2 Failed - 09/29/2023 11:29 AM      Failed - This refill cannot be delegated      Failed - Urine Drug Screen completed in last 360 days      Failed - Valid encounter within last 6 months    Recent Outpatient Visits   None            Passed - Patient is not pregnant      Refused Prescriptions Disp Refills   amLODipine  (NORVASC ) 5 MG tablet 90 tablet 3    Sig: Take 0.5 tablets (2.5 mg total) by mouth 2 (two) times daily.     Cardiovascular: Calcium Channel Blockers 2 Failed - 09/29/2023 11:29 AM      Failed - Valid encounter within last 6 months    Recent Outpatient Visits   None            Passed - Last BP in normal range    BP Readings from Last 1 Encounters:  09/16/23 138/89         Passed - Last Heart Rate in normal range    Pulse Readings from Last 1 Encounters:  09/16/23 82

## 2023-09-29 NOTE — Telephone Encounter (Signed)
 Attempted to contact pharmacy to verify receipt confirmed by pharmacy 09/29/23 at 7:57 am. Unable to speak with pharmacy staff.

## 2023-09-30 ENCOUNTER — Other Ambulatory Visit: Payer: Self-pay | Admitting: Family Medicine

## 2023-09-30 ENCOUNTER — Other Ambulatory Visit: Payer: Self-pay

## 2023-09-30 DIAGNOSIS — J4540 Moderate persistent asthma, uncomplicated: Secondary | ICD-10-CM

## 2023-10-01 NOTE — Telephone Encounter (Signed)
 Requested Prescriptions  Pending Prescriptions Disp Refills   albuterol  (VENTOLIN  HFA) 108 (90 Base) MCG/ACT inhaler [Pharmacy Med Name: ALBUTEROL  HFA (PROAIR ) INHALER] 18 each 2    Sig: INHALE 2 PUFFS INTO THE LUNGS EVERY 4 (FOUR) HOURS AS NEEDED FOR WHEEZING OR SHORTNESS OF BREATH. OFFICE VISIT NEEDED FOR ADDITIONAL REFILLS     Pulmonology:  Beta Agonists 2 Failed - 10/01/2023 11:58 AM      Failed - Valid encounter within last 12 months    Recent Outpatient Visits   None            Passed - Last BP in normal range    BP Readings from Last 1 Encounters:  09/16/23 138/89         Passed - Last Heart Rate in normal range    Pulse Readings from Last 1 Encounters:  09/16/23 82

## 2023-10-07 ENCOUNTER — Ambulatory Visit (INDEPENDENT_AMBULATORY_CARE_PROVIDER_SITE_OTHER): Admitting: Family Medicine

## 2023-10-07 ENCOUNTER — Encounter: Payer: Self-pay | Admitting: Family Medicine

## 2023-10-07 VITALS — BP 115/82 | HR 77 | Ht 74.0 in | Wt 220.4 lb

## 2023-10-07 DIAGNOSIS — G4719 Other hypersomnia: Secondary | ICD-10-CM

## 2023-10-07 DIAGNOSIS — I10 Essential (primary) hypertension: Secondary | ICD-10-CM

## 2023-10-07 DIAGNOSIS — Z8249 Family history of ischemic heart disease and other diseases of the circulatory system: Secondary | ICD-10-CM

## 2023-10-07 DIAGNOSIS — F410 Panic disorder [episodic paroxysmal anxiety] without agoraphobia: Secondary | ICD-10-CM

## 2023-10-07 DIAGNOSIS — R0789 Other chest pain: Secondary | ICD-10-CM

## 2023-10-07 DIAGNOSIS — F411 Generalized anxiety disorder: Secondary | ICD-10-CM | POA: Diagnosis not present

## 2023-10-07 DIAGNOSIS — R29818 Other symptoms and signs involving the nervous system: Secondary | ICD-10-CM

## 2023-10-07 DIAGNOSIS — J329 Chronic sinusitis, unspecified: Secondary | ICD-10-CM

## 2023-10-07 MED ORDER — LORAZEPAM 0.5 MG PO TABS: ORAL_TABLET | ORAL | 2 refills | Status: DC

## 2023-10-07 MED ORDER — AMLODIPINE BESYLATE 5 MG PO TABS: 2.5000 mg | ORAL_TABLET | Freq: Two times a day (BID) | ORAL | 3 refills | Status: DC

## 2023-10-07 NOTE — Patient Instructions (Addendum)
 Thank you for coming to the office today.  Pill count increase Lorazepam   Refilled medication  [facility] [address] [place], [place] [address] Main: [phone]  Likely Costochondritis episodic or flare Caution with heavy lifting turning twisting coughing  ----------------  ENT for sinus issues related to sleep possible sleep apnea.  [facility] [address]  [place], [place] [address] Ph: [phone]  [facility] Home Sleep Study test  -------------------------------------   Please schedule a Follow-up Appointment to: Return if symptoms worsen or fail to improve.  If you have any other questions or concerns, please feel free to call the office or send a message through MyChart. You may also schedule an earlier appointment if necessary.  Additionally, you may be receiving a survey about your experience at our office within a few days to 1 week by e-mail or mail. We value your feedback.  Domingo Friend, DO [place], [place]

## 2023-10-07 NOTE — Progress Notes (Signed)
 Subjective:    Patient ID: Jesse Blanchard, male    DOB: 04-18-1986, 38 y.o.   MRN: [id number]  Jesse Blanchard is a 38 y.o. male presenting on 10/07/2023 for Obstructive Sleep Apnea and Atypical Chest Pain   HPI  Discussed the use of AI scribe software for clinical note transcription with the patient, who gave verbal consent to proceed.  History of Present Illness   Jesse Blanchard is a 38 year old male who presents for a medication refill and follow-up after an ER visit for chest pain.   ED FOLLOW-UP VISIT  Hospital/Location: [facility] Date of ED Visit: 09/16/23  Reason for Presenting to ED: Atypical Chest Pain  FOLLOW-UP  - ED provider note and record have been reviewed - Patient presents today about 3 weeks after recent ED visit. Brief summary of recent course,  He has experienced episodes of chest pain characterized by tightness and sharp stabbing sensations in the left lower rib area. The pain was significant enough to wake him from sleep and was not consistently triggered by physical activity or deep breathing, although turning or stretching sometimes exacerbated it. The symptoms have subsided over the past week and a half but were ongoing for about a month prior to his emergency room visit on Sep 16, 2023. During that visit, he underwent an x-ray and blood tests, including heart enzyme tests, which returned normal results. An EKG was also performed and was normal. He was prescribed naproxen , which he took for the first two days, but has not used it consistently since the symptoms have subsided.  He has a family history of heart disease, with his father having passed away at the age of 39 and a grandfather who also had heart disease.  He has been experiencing increased stress due to losing his job last year and his 55 year old son moving in with him. This stress has led to more frequent use of lorazepam , which he is currently taking at a dose of 0.5 mg. He is interested in a larger  prescription due to increased usage related to stress.  He is interested in exploring the possibility of sleep apnea as a contributing factor to his symptoms, as he has experienced difficulty breathing during sleep, especially when waking up feeling like he is fighting for air. Issue with chronic sinus congestion and pressure and difficulty breathing.  He has a history of not taking his prescribed Lexapro  regularly, although he has been taking it more consistently recently. He is currently taking amlodipine , half a tablet twice a day, for blood pressure management, which he finds effective.     - Today reports overall has done well after discharge from ED. Symptoms of chest pain have resolved  - New medications on discharge: Naproxen  - Changes to current meds on discharge: none   Suspected Sleep Apnea Chronic Sinusitis Long history for >10-20 years with difficulty breathing through sinuses and congestion. He has had snoring and problems breathing at night. Worsening now. Requesting evaluation. History of reported witnessed apnea event  Epworth Sleepiness Scale Total Score: 12 Sitting and reading - 2 Watching TV - 2 Sitting inactive in a public place - 2 As a passenger in a car for an hour without a break - 3 Lying down to rest in the afternoon when circumstances permit - 3 Sitting and talking to someone - 0 Sitting quietly after a lunch without alcohol - 0 In a car, while stopped for a few minutes in traffic - 0   I have reviewed the  discharge medication list, and have reconciled the current and discharge medications today.       10/07/2023   10:38 AM 10/03/2022    4:01 PM 07/09/2022    3:31 PM  Depression screen PHQ 2/9  Decreased Interest 1 1 0  Down, Depressed, Hopeless 0 0 0  PHQ - 2 Score 1 1 0  Altered sleeping 3 3 0  Tired, decreased energy 3 3 1   Change in appetite 2 1 0  Feeling bad or failure about yourself  2 0 0  Trouble concentrating 3 0 0  Moving slowly or  fidgety/restless 0 0 0  Suicidal thoughts 0 0 0  PHQ-9 Score 14 8 1   Difficult doing work/chores Not difficult at all Not difficult at all Not difficult at all       10/07/2023   10:38 AM 10/03/2022    4:02 PM 07/09/2022    3:31 PM 06/26/2021    9:07 AM  GAD 7 : Generalized Anxiety Score  Nervous, Anxious, on Edge 2 2 1 2   Control/stop worrying 2 2 1 2   Worry too much - different things 2 3 3 2   Trouble relaxing 3 1 1 2   Restless 1 1 2 1   Easily annoyed or irritable 1 2 2 1   Afraid - awful might happen 1 2 1 1   Total GAD 7 Score 12 13 11 11   Anxiety Difficulty Not difficult at all  Somewhat difficult Somewhat difficult    Social History   Tobacco Use   Smoking status: Never   Smokeless tobacco: Current    Types: Chew   Tobacco comments:    1 can chewing tobacco daily, 15 years  Vaping Use   Vaping status: Never Used  Substance Use Topics   Alcohol use: Yes    Comment: occasionally weekends   Drug use: No    Review of Systems Per HPI unless specifically indicated above     Objective:    BP 115/82 (BP Location: Left Arm, Patient Position: Sitting, Cuff Size: Normal)   Pulse 77   Ht 6' 2 (1.88 m)   Wt 220 lb 6.4 oz (100 kg)   SpO2 97%   BMI 28.30 kg/m   Wt Readings from Last 3 Encounters:  10/07/23 220 lb 6.4 oz (100 kg)  09/16/23 215 lb (97.5 kg)  10/03/22 227 lb (103 kg)    Physical Exam Vitals and nursing note reviewed.  Constitutional:      General: He is not in acute distress.    Appearance: He is well-developed. He is not diaphoretic.     Comments: Well-appearing, comfortable, cooperative  HENT:     Head: Normocephalic and atraumatic.     Nose:     Comments: Turbinate hypertrophy  Eyes:     General:        Right eye: No discharge.        Left eye: No discharge.     Conjunctiva/sclera: Conjunctivae normal.   Neck:     Thyroid: No thyromegaly.   Cardiovascular:     Rate and Rhythm: Normal rate and regular rhythm.     Pulses: Normal pulses.      Heart sounds: Normal heart sounds. No murmur heard. Pulmonary:     Effort: Pulmonary effort is normal. No respiratory distress.     Breath sounds: Normal breath sounds. No wheezing or rales.   Musculoskeletal:        General: Normal range of motion.     Cervical back:  Normal range of motion and neck supple.  Lymphadenopathy:     Cervical: No cervical adenopathy.   Skin:    General: Skin is warm and dry.     Findings: No erythema or rash.   Neurological:     Mental Status: He is alert and oriented to person, place, and time. Mental status is at baseline.   Psychiatric:        Behavior: Behavior normal.     Comments: Well groomed, good eye contact, normal speech and thoughts     I have personally reviewed the radiology report from 09/16/23 on Chest X-ray.  CLINICAL DATA:  Chest pain for over 1 week.   EXAM: CHEST - 2 VIEW   COMPARISON:  07/23/2021.   FINDINGS: Normal heart, mediastinum and hila.   Clear lungs.  No pleural effusion or pneumothorax.   Skeletal structures are unremarkable.   IMPRESSION: Normal chest radiographs.     Electronically Signed   By: Amanda Jungling M.D.   On: 09/16/2023 17:46  Results for orders placed or performed during the hospital encounter of 09/16/23  Basic metabolic panel   Collection Time: 09/16/23  4:50 PM  Result Value Ref Range   Sodium 139 135 - 145 mmol/L   Potassium 4.0 3.5 - 5.1 mmol/L   Chloride 104 98 - 111 mmol/L   CO2 26 22 - 32 mmol/L   Glucose, Bld 100 (H) 70 - 99 mg/dL   BUN 12 6 - 20 mg/dL   Creatinine, Ser 5.28 0.61 - 1.24 mg/dL   Calcium 9.9 8.9 - 41.3 mg/dL   GFR, Estimated >24 >40 mL/min   Anion gap 9 5 - 15  CBC   Collection Time: 09/16/23  4:50 PM  Result Value Ref Range   WBC 8.2 4.0 - 10.5 K/uL   RBC 5.07 4.22 - 5.81 MIL/uL   Hemoglobin 15.7 13.0 - 17.0 g/dL   HCT 10.2 72.5 - 36.6 %   MCV 89.9 80.0 - 100.0 fL   MCH 31.0 26.0 - 34.0 pg   MCHC 34.4 30.0 - 36.0 g/dL   RDW 44.0 34.7 - 42.5 %    Platelets 262 150 - 400 K/uL   nRBC 0.0 0.0 - 0.2 %  Hepatic function panel   Collection Time: 09/16/23  4:50 PM  Result Value Ref Range   Total Protein 7.1 6.5 - 8.1 g/dL   Albumin 4.4 3.5 - 5.0 g/dL   AST 19 15 - 41 U/L   ALT 40 0 - 44 U/L   Alkaline Phosphatase 44 38 - 126 U/L   Total Bilirubin 1.0 0.0 - 1.2 mg/dL   Bilirubin, Direct <9.5 0.0 - 0.2 mg/dL   Indirect Bilirubin NOT CALCULATED 0.3 - 0.9 mg/dL  Troponin I (High Sensitivity)   Collection Time: 09/16/23  4:50 PM  Result Value Ref Range   Troponin I (High Sensitivity) 6 <18 ng/L      Assessment & Plan:   Problem List Items Addressed This Visit     Essential hypertension   Relevant Medications   amLODipine  (NORVASC ) 5 MG tablet   Other Relevant Orders   Ambulatory referral to Cardiology   Generalized anxiety disorder with panic attacks   Relevant Medications   LORazepam  (ATIVAN ) 0.5 MG tablet   Other Visit Diagnoses       Atypical chest pain    -  Primary   Relevant Orders   Ambulatory referral to Cardiology     Family history of heart disease  Relevant Orders   Ambulatory referral to Cardiology     Chronic sinusitis, unspecified location       Relevant Orders   Ambulatory referral to ENT     Suspected sleep apnea       Relevant Orders   Ambulatory referral to ENT   Home sleep test     Excessive daytime sleepiness       Relevant Orders   Home sleep test       ED Follow-up  Atypical Chest Pain Costochondritis ED work up negative for cardiac etiology, negative troponin EKG CXR Intermittent chest pain likely due to costochondritis, not cardiac. Explained costochondritis as rib cartilage inflammation, often episodic and non-harmful. - Refer to cardiology for second opinion due to family history of heart disease age 43+ - Continue naproxen  as needed for pain, one-week course if symptoms flare. - Avoid heavy lifting, turning, twisting, and activities straining rib area.  Hypertension Chronic  hypertension managed with amlodipine , adjusted dosing due to side effects. - Renew amlodipine  2.5mg  BID prescription with current dosing regimen.  Anxiety Increased lorazepam  use due to stressors. Discussed increasing prescription for flexible use. He was on very low quantity 10 per order, using them more frequently now Continue lorazepam  0.5mg  daily AS NEEDED, no dose increase - Increase lorazepam  prescription to 30 pills with two refills.   Suspected Obstructive Sleep Apnea Excessive Daytime Sleepiness Chronic Sinusitis Suspected sleep apnea possibly related to sinus issues. Discussed ENT evaluation and sleep study - Refer to ENT for sinus evaluation. - Order home sleep study through [facility].  General Health Maintenance Discussed future cardiac screening CT Coronary Calcium Score due to family history. Heart scan considered after age 55 unless significant risk factors present. - Consider heart scan after age 54 if family history warrants.        Orders Placed This Encounter  Procedures   Ambulatory referral to Cardiology    Referral Priority:   Routine    Referral Type:   Consultation    Referral Reason:   Specialty Services Required    Number of Visits Requested:   1   Ambulatory referral to ENT    Referral Priority:   Routine    Referral Type:   Consultation    Referral Reason:   Specialty Services Required    Requested Specialty:   Otolaryngology    Number of Visits Requested:   1   Home sleep test    [facility], Home Sleep Study.    Where should this test be performed::   Other    Meds ordered this encounter  Medications   amLODipine  (NORVASC ) 5 MG tablet    Sig: Take 0.5 tablets (2.5 mg total) by mouth 2 (two) times daily.    Dispense:  90 tablet    Refill:  3   LORazepam  (ATIVAN ) 0.5 MG tablet    Sig: TAKE 1 TABLET BY MOUTH EVERY DAY AS NEEDED FOR ANXIETY    Dispense:  30 tablet    Refill:  2    Increase pill count    Follow up plan: Return  if symptoms worsen or fail to improve.   Domingo Friend, DO [facility] 10/07/2023, 10:44 AM

## 2023-11-12 ENCOUNTER — Telehealth: Payer: Self-pay

## 2023-11-12 NOTE — Telephone Encounter (Signed)
 Copied from CRM [id number]. Topic: Clinical - Medication Question >> Nov 11, 2023  4:33 PM Delon DASEN wrote: Reason for CRM: Josh with [facility]- [phone]- need history of use of Flonase  for the patient- fax [phone]

## 2023-11-25 ENCOUNTER — Encounter: Payer: Self-pay | Admitting: Family Medicine

## 2023-11-25 ENCOUNTER — Other Ambulatory Visit: Payer: Self-pay | Admitting: Family Medicine

## 2023-11-25 DIAGNOSIS — I10 Essential (primary) hypertension: Secondary | ICD-10-CM

## 2023-11-25 MED ORDER — AMLODIPINE BESYLATE 5 MG PO TABS: 2.5000 mg | ORAL_TABLET | Freq: Two times a day (BID) | ORAL | 3 refills | Status: AC

## 2023-11-25 NOTE — Telephone Encounter (Signed)
 Copied from CRM #[id number]. Topic: Clinical - Medication Refill >> Nov 25, 2023 10:30 AM Tiffini S wrote: Medication: amLODipine  (NORVASC ) 5 MG tablet- patient spouse was cleaning up home- medication is loss   Has the patient contacted their pharmacy? Yes, was over the phone/ couldn't refill (Agent: If no, request that the patient contact the pharmacy for the refill. If patient does not wish to contact the pharmacy document the reason why and proceed with request.) (Agent: If yes, when and what did the pharmacy advise?)  This is the patient's preferred pharmacy:  [id number] - [place], [place] - [address] [place] [address] Phone: [phone] Fax: [phone]  Is this the correct pharmacy for this prescription? Yes If no, delete pharmacy and type the correct one.   Has the prescription been filled recently? Yes  Is the patient out of the medication? Yes, took last tablet yesterday morning   Has the patient been seen for an appointment in the last year OR does the patient have an upcoming appointment? Yes  Can we respond through MyChart? No, please call spouse at [phone]/ afternoon please call patient at [phone]  Agent: Please be advised that Rx refills may take up to 3 business days. We ask that you follow-up with your pharmacy.

## 2023-12-07 NOTE — Progress Notes (Unsigned)
 Cardiology Office Note  Date:  12/08/2023   ID:  Jesse Blanchard, DOB [birth year], MRN [id number]  PCP:  Edman Marsa PARAS, DO   Chief Complaint  Patient presents with   New Patient (Initial Visit)    Ref by Dr. Edman for atypical chest pain. Patient c/o chest pain that radiates up into his shoulders, down both arms with a tingling sensation in hands that may last for a couple of seconds and then goes away.     HPI:  Jesse Blanchard is a 38 y.o. male with past medical history of: Hypertension Hyperlipidemia GAD with panic attacks Who presents by referral from Dr. Edman for consultation of his atypical chest pain, family history of heart disease  Seen in the emergency room 12/17/2023 chest pain mid to left-sided chest pain that has been intermittent for the past week. It has awaken him from sleep a few times. Pain is a sharp stabbing pain in the left lower anterior rib area.  ER recommended naproxen  for chest wall pain  In follow-up today reports that some of the left lower rib pain has improved Occasionally when laying in bed or at rest will have mid central left chest pain discomfort nonradiating Sometimes does heavy activity at work No significant chest pain on walking or exertion  Concerned about underlying coronary disease given strong family history  Total cholesterol typically running 230 up to 270 Most recent A1c 6.5 in 2024  EKG personally reviewed by myself on todays visit EKG Interpretation Date/Time:  Tuesday December 08 2023 11:13:22 EDT Ventricular Rate:  96 PR Interval:  164 QRS Duration:  92 QT Interval:  340 QTC Calculation: 429 R Axis:   104  Text Interpretation: Sinus rhythm with PACs When compared with ECG of 16-Sep-2023 16:50, now with PACs Confirmed by Perla Lye [phone]) on 12/08/2023 11:19:34 AM   Family history father having passed away at the age of 28 and a grandfather who also had heart disease.  Grandmother with CAD   PMH:    has a past medical history of Asthma.  PSH:    Past Surgical History:  Procedure Laterality Date   SHOULDER SURGERY Left 07/2021    Current Outpatient Medications  Medication Sig Dispense Refill   albuterol  (VENTOLIN  HFA) 108 (90 Base) MCG/ACT inhaler INHALE 2 PUFFS INTO THE LUNGS EVERY 4 (FOUR) HOURS AS NEEDED FOR WHEEZING OR SHORTNESS OF BREATH. OFFICE VISIT NEEDED FOR ADDITIONAL REFILLS 18 each 2   amLODipine  (NORVASC ) 5 MG tablet Take 0.5 tablets (2.5 mg total) by mouth 2 (two) times daily. 90 tablet 3   escitalopram  (LEXAPRO ) 10 MG tablet Take 1 tablet (10 mg total) by mouth daily. 90 tablet 3   fluticasone  (FLONASE ) 50 MCG/ACT nasal spray Place 2 sprays into both nostrils daily. Use for 4-6 weeks then stop and use seasonally or as needed. 16 g 3   fluticasone -salmeterol (ADVAIR) 250-50 MCG/ACT AEPB Inhale 1 puff into the lungs in the morning and at bedtime. 60 each 11   LORazepam  (ATIVAN ) 0.5 MG tablet TAKE 1 TABLET BY MOUTH EVERY DAY AS NEEDED FOR ANXIETY 30 tablet 2   montelukast  (SINGULAIR ) 10 MG tablet Take 1 tablet (10 mg total) by mouth at bedtime. 90 tablet 3   rosuvastatin  (CRESTOR ) 20 MG tablet Take 1 tablet (20 mg total) by mouth daily. 90 tablet 3   No current facility-administered medications for this visit.   Allergies:   Patient has no known allergies.   Social History:  The patient  reports  that he has never smoked. His smokeless tobacco use includes chew. He reports current alcohol use. He reports that he does not use drugs.   Family History:   family history includes Cancer in his maternal grandfather; Colon cancer in his maternal uncle; Diabetes in his father, mother, paternal grandfather, paternal uncle, and sister; Heart attack (age of onset: 21) in his father; Heart attack (age of onset: 69) in his paternal grandfather; Heart attack (age of onset: 32) in his maternal grandmother; Heart disease in his father and paternal grandfather; Hypertension in his sister.     Review of Systems: Review of Systems  Constitutional: Negative.   HENT: Negative.    Respiratory: Negative.    Cardiovascular:  Positive for chest pain.  Gastrointestinal: Negative.   Musculoskeletal: Negative.   Neurological: Negative.   Psychiatric/Behavioral: Negative.    All other systems reviewed and are negative.  PHYSICAL EXAM: VS:  BP 130/80 (BP Location: Right Arm, Patient Position: Sitting, Cuff Size: Normal)   Pulse 96   Ht 6' 2 (1.88 m)   Wt 234 lb 6 oz (106.3 kg)   SpO2 97%   BMI 30.09 kg/m  , BMI Body mass index is 30.09 kg/m. GEN: Well nourished, well developed, in no acute distress HEENT: normal Neck: no JVD, carotid bruits, or masses Cardiac: RRR; no murmurs, rubs, or gallops,no edema  Respiratory:  clear to auscultation bilaterally, normal work of breathing GI: soft, nontender, nondistended, + BS MS: no deformity or atrophy Skin: warm and dry, no rash Neuro:  Strength and sensation are intact Psych: euthymic mood, full affect  Recent Labs: 09/16/2023: ALT 40; BUN 12; Creatinine, Ser 1.09; Hemoglobin 15.7; Platelets 262; Potassium 4.0; Sodium 139    Lipid Panel Lab Results  Component Value Date   CHOL 272 (H) 09/29/2022   HDL 36 (L) 09/29/2022   LDLCALC  09/29/2022     Comment:     . LDL cholesterol not calculated. Triglyceride levels greater than 400 mg/dL invalidate calculated LDL results. . Reference range: <100 . Desirable range <100 mg/dL for primary prevention;   <70 mg/dL for patients with CHD or diabetic patients  with > or = 2 CHD risk factors. SABRA LDL-C is now calculated using the Martin-Hopkins  calculation, which is a validated novel method providing  better accuracy than the Friedewald equation in the  estimation of LDL-C.  Gladis APPLETHWAITE et al. SANDREA. 7986;689(80): 2061-2068  (http://education.[facility].com/faq/FAQ164)    TRIG 548 (H) 09/29/2022      Wt Readings from Last 3 Encounters:  12/08/23 234 lb 6 oz (106.3  kg)  10/07/23 220 lb 6.4 oz (100 kg)  09/16/23 215 lb (97.5 kg)       ASSESSMENT AND PLAN:  Problem List Items Addressed This Visit       Cardiology Problems   Essential hypertension   Relevant Medications   rosuvastatin  (CRESTOR ) 20 MG tablet   Other Relevant Orders   EKG 12-Lead (Completed)   Mixed hyperlipidemia   Relevant Medications   rosuvastatin  (CRESTOR ) 20 MG tablet   Other Relevant Orders   Lipid panel     Other   Generalized anxiety disorder with panic attacks   Other Visit Diagnoses       Chest pain, unspecified type    -  Primary   Relevant Orders   EKG 12-Lead (Completed)   CT CARDIAC SCORING (SELF PAY ONLY)     SOB (shortness of breath) on exertion       Relevant Orders  EKG 12-Lead (Completed)   CT CARDIAC SCORING (SELF PAY ONLY)     Medication management       Relevant Orders   Hemoglobin A1c     Family history of cardiac disorder         Elevated glucose          Atypical chest pain Likely chest wall symptoms, possibly exacerbated by work or heavy lifting Less likely angina  Strong family history cardiac disease Father with MI, grandfather also with coronary disease Recommended CT calcium  scoring for further evaluation Management of hyperlipidemia as below  Hyperlipidemia Total cholesterol 230 up to 270 Calcium  score pending, recommend he start Crestor  20 mg daily  Essential hypertension Continue amlodipine  5 daily He does report having elevated heart rate, could consider metoprolol succinate 12.5 up to 25 daily if desired for rate control  Elevated glucose A1c 6.5 last year, recommended repeat A1c today Diet modification  Patient seen in consultation for Dr. Edman and will be referred back to his office for ongoing care of the issues detailed above  Signed, Velinda Lunger, M.D., Ph.D. [facility] [place], [place] [phone]

## 2023-12-08 ENCOUNTER — Ambulatory Visit: Attending: Cardiovascular Disease | Admitting: Cardiovascular Disease

## 2023-12-08 ENCOUNTER — Encounter: Payer: Self-pay | Admitting: Cardiovascular Disease

## 2023-12-08 VITALS — BP 130/80 | HR 96 | Ht 74.0 in | Wt 234.4 lb

## 2023-12-08 DIAGNOSIS — R079 Chest pain, unspecified: Secondary | ICD-10-CM

## 2023-12-08 DIAGNOSIS — Z8249 Family history of ischemic heart disease and other diseases of the circulatory system: Secondary | ICD-10-CM

## 2023-12-08 DIAGNOSIS — R7309 Other abnormal glucose: Secondary | ICD-10-CM

## 2023-12-08 DIAGNOSIS — F411 Generalized anxiety disorder: Secondary | ICD-10-CM

## 2023-12-08 DIAGNOSIS — Z79899 Other long term (current) drug therapy: Secondary | ICD-10-CM

## 2023-12-08 DIAGNOSIS — E782 Mixed hyperlipidemia: Secondary | ICD-10-CM

## 2023-12-08 DIAGNOSIS — I10 Essential (primary) hypertension: Secondary | ICD-10-CM

## 2023-12-08 DIAGNOSIS — F410 Panic disorder [episodic paroxysmal anxiety] without agoraphobia: Secondary | ICD-10-CM

## 2023-12-08 DIAGNOSIS — R0602 Shortness of breath: Secondary | ICD-10-CM

## 2023-12-08 MED ORDER — ROSUVASTATIN CALCIUM 20 MG PO TABS: 20.0000 mg | ORAL_TABLET | Freq: Every day | ORAL | 3 refills | Status: AC

## 2023-12-08 NOTE — Patient Instructions (Addendum)
 Medication Instructions:   Please crestor  20 mg daily  If you need a refill on your cardiac medications before your next appointment, please call your pharmacy.   Lab work:  Your provider would like for you to have following labs drawn today HGB A1C.  Your provider would like for you to return in 3 month to have the following labs drawn: Lipid.   Please go to [facility] [address] ([facility] [address], [place] [address] You do not need an appointment.  They are open from 8 am- 4:30 pm.  Lunch from 1:00 pm- 2:00 pm You will need to be fasting.   Testing/Procedures: CT coronary calcium  score.   - $99 out of pocket cost at the time of your test - Call [phone] to schedule at your convenience.  [facility] [address] [place], [place] [address]   Coronary CalciumScan A coronary calcium  scan is an imaging test used to look for deposits of calcium  and other fatty materials (plaques) in the inner lining of the blood vessels of the heart (coronary arteries). These deposits of calcium  and plaques can partly clog and narrow the coronary arteries without producing any symptoms or warning signs. This puts a person at risk for a heart attack. This test can detect these deposits before symptoms develop. Tell a health care provider about: Any allergies you have. All medicines you are taking, including vitamins, herbs, eye drops, creams, and over-the-counter medicines. Any problems you or family members have had with anesthetic medicines. Any blood disorders you have. Any surgeries you have had. Any medical conditions you have. Whether you are pregnant or may be pregnant. What are the risks? Generally, this is a safe procedure. However, problems may occur, including: Harm to a pregnant woman and her unborn baby. This test involves the use of radiation. Radiation exposure can be dangerous to a pregnant woman and  her unborn baby. If you are pregnant, you generally should not have this procedure done. Slight increase in the risk of cancer. This is because of the radiation involved in the test. What happens before the procedure? No preparation is needed for this procedure. What happens during the procedure? You will undress and remove any jewelry around your neck or chest. You will put on a hospital gown. Sticky electrodes will be placed on your chest. The electrodes will be connected to an electrocardiogram (ECG) machine to record a tracing of the electrical activity of your heart. A CT scanner will take pictures of your heart. During this time, you will be asked to lie still and hold your breath for 2-3 seconds while a picture of your heart is being taken. The procedure may vary among health care providers and hospitals. What happens after the procedure? You can get dressed. You can return to your normal activities. It is up to you to get the results of your test. Ask your health care provider, or the department that is doing the test, when your results will be ready. Summary A coronary calcium  scan is an imaging test used to look for deposits of calcium  and other fatty materials (plaques) in the inner lining of the blood vessels of the heart (coronary arteries). Generally, this is a safe procedure. Tell your health care provider if you are pregnant or may be pregnant. No preparation is needed for this procedure. A CT scanner will take pictures of your heart. You can return to your normal activities after the scan is  done. This information is not intended to replace advice given to you by your health care provider. Make sure you discuss any questions you have with your health care provider. Document Released: 10/04/2007 Document Revised: 02/25/2016 Document Reviewed: 02/25/2016 Elsevier Interactive Patient Education  2017 [employer].   Follow-Up: At [facility], you and your health needs are  our priority.  As part of our continuing mission to provide you with exceptional heart care, we have created designated Provider Care Teams.  These Care Teams include your primary Cardiologist (physician) and Advanced Practice Providers (APPs -  Physician Assistants and Nurse Practitioners) who all work together to provide you with the care you need, when you need it.  You will need a follow up appointment as needed  Providers on your designated Care Team:   Lonni Meager, NP Bernardino Bring, PA-C Cadence [place], [place]  COVID-19 Vaccine Information can be found at: [email or web address] For questions related to vaccine distribution or appointments, please email [email or web address] or call [phone].

## 2023-12-09 LAB — HEMOGLOBIN A1C
Est. average glucose Bld gHb Est-mCnc: 157 mg/dL
Hgb A1c MFr Bld: 7.1 % — ABNORMAL HIGH (ref 4.8–5.6)

## 2023-12-11 ENCOUNTER — Ambulatory Visit: Payer: Self-pay | Admitting: Cardiovascular Disease

## 2024-01-12 ENCOUNTER — Ambulatory Visit
Admission: RE | Admit: 2024-01-12 | Discharge: 2024-01-12 | Disposition: A | Discharge status: Home / Self Care | Payer: Self-pay | Source: Ambulatory Visit | Attending: Cardiovascular Disease | Admitting: Cardiovascular Disease

## 2024-01-12 DIAGNOSIS — R079 Chest pain, unspecified: Secondary | ICD-10-CM | POA: Insufficient documentation

## 2024-01-12 DIAGNOSIS — R0602 Shortness of breath: Secondary | ICD-10-CM | POA: Insufficient documentation

## 2024-01-17 ENCOUNTER — Other Ambulatory Visit: Payer: Self-pay | Admitting: Family Medicine

## 2024-01-17 DIAGNOSIS — J4540 Moderate persistent asthma, uncomplicated: Secondary | ICD-10-CM

## 2024-01-19 NOTE — Telephone Encounter (Signed)
 Requested Prescriptions  Pending Prescriptions Disp Refills   albuterol  (VENTOLIN  HFA) 108 (90 Base) MCG/ACT inhaler [Pharmacy Med Name: ALBUTEROL  HFA (PROAIR ) INHALER] 8.5 each 2    Sig: INHALE 2 PUFFS INTO THE LUNGS EVERY 4 (FOUR) HOURS AS NEEDED FOR WHEEZING OR SHORTNESS OF BREATH. OFFICE VISIT NEEDED FOR ADDITIONAL REFILLS     Pulmonology:  Beta Agonists 2 Passed - 01/19/2024  3:32 PM      Passed - Last BP in normal range    BP Readings from Last 1 Encounters:  12/08/23 130/80         Passed - Last Heart Rate in normal range    Pulse Readings from Last 1 Encounters:  12/08/23 96         Passed - Valid encounter within last 12 months    Recent Outpatient Visits           3 months ago Atypical chest pain   [facility] [place], [place], [place]

## 2024-02-02 ENCOUNTER — Other Ambulatory Visit: Payer: Self-pay | Admitting: Family Medicine

## 2024-02-02 DIAGNOSIS — F410 Panic disorder [episodic paroxysmal anxiety] without agoraphobia: Secondary | ICD-10-CM

## 2024-02-03 ENCOUNTER — Other Ambulatory Visit: Payer: Self-pay | Admitting: Family Medicine

## 2024-02-03 DIAGNOSIS — F410 Panic disorder [episodic paroxysmal anxiety] without agoraphobia: Secondary | ICD-10-CM

## 2024-02-03 NOTE — Telephone Encounter (Signed)
 Copied from CRM [id number]. Topic: Clinical - Medication Refill >> Feb 03, 2024  4:39 PM Everette C wrote: Medication: LORazepam  (ATIVAN ) 0.5 MG tablet  Has the patient contacted their pharmacy? Yes (Agent: If no, request that the patient contact the pharmacy for the refill. If patient does not wish to contact the pharmacy document the reason why and proceed with request.) (Agent: If yes, when and what did the pharmacy advise?)  This is the patient's preferred pharmacy:  [id number] - [place], [place] - [address] [place] [address] Phone: [phone] Fax: [phone]  Is this the correct pharmacy for this prescription? Yes If no, delete pharmacy and type the correct one.   Has the prescription been filled recently? Yes  Is the patient out of the medication? No  Has the patient been seen for an appointment in the last year OR does the patient have an upcoming appointment? Yes  Can we respond through MyChart? No  Agent: Please be advised that Rx refills may take up to 3 business days. We ask that you follow-up with your pharmacy.

## 2024-02-04 NOTE — Telephone Encounter (Signed)
 Requested medication (s) are due for refill today: yes  Requested medication (s) are on the active medication list: yes  Last refill:  10/07/23  Future visit scheduled: yes  Notes to clinic:  Unable to refill per protocol, cannot delegate.      Requested Prescriptions  Pending Prescriptions Disp Refills   LORazepam  (ATIVAN ) 0.5 MG tablet [Pharmacy Med Name: LORAZEPAM  0.5 MG TABLET] 30 tablet 2    Sig: TAKE 1 TABLET BY MOUTH EVERY DAY AS NEEDED FOR ANXIETY     Not Delegated - Psychiatry: Anxiolytics/Hypnotics 2 Failed - 02/04/2024  3:02 PM      Failed - This refill cannot be delegated      Failed - Urine Drug Screen completed in last 360 days      Passed - Patient is not pregnant      Passed - Valid encounter within last 6 months    Recent Outpatient Visits           4 months ago Atypical chest pain   [facility] [place], [place], [place]

## 2024-02-05 MED ORDER — LORAZEPAM 0.5 MG PO TABS: ORAL_TABLET | ORAL | 2 refills | Status: DC

## 2024-02-05 NOTE — Telephone Encounter (Signed)
 Requested medication (s) are due for refill today: yes  Requested medication (s) are on the active medication list: yes  Last refill:  10/07/23  Future visit scheduled: no  Notes to clinic:  Unable to refill per protocol, cannot delegate.      Requested Prescriptions  Pending Prescriptions Disp Refills   LORazepam  (ATIVAN ) 0.5 MG tablet 30 tablet 2    Sig: TAKE 1 TABLET BY MOUTH EVERY DAY AS NEEDED FOR ANXIETY     Not Delegated - Psychiatry: Anxiolytics/Hypnotics 2 Failed - 02/05/2024  1:17 PM      Failed - This refill cannot be delegated      Failed - Urine Drug Screen completed in last 360 days      Passed - Patient is not pregnant      Passed - Valid encounter within last 6 months    Recent Outpatient Visits           4 months ago Atypical chest pain   [place] [facility] [place], [place], [place]

## 2024-03-14 ENCOUNTER — Encounter: Payer: Self-pay | Admitting: Emergency Medicine

## 2024-04-11 ENCOUNTER — Telehealth: Admitting: Physician Assistant

## 2024-04-11 DIAGNOSIS — B9689 Other specified bacterial agents as the cause of diseases classified elsewhere: Secondary | ICD-10-CM | POA: Diagnosis not present

## 2024-04-11 DIAGNOSIS — J208 Acute bronchitis due to other specified organisms: Secondary | ICD-10-CM

## 2024-04-11 MED ORDER — AZITHROMYCIN 250 MG PO TABS: ORAL_TABLET | ORAL | 0 refills | Status: AC

## 2024-04-11 MED ORDER — PREDNISONE 20 MG PO TABS: 40.0000 mg | ORAL_TABLET | Freq: Every day | ORAL | 0 refills | Status: AC

## 2024-04-11 MED ORDER — BENZONATATE 100 MG PO CAPS: 100.0000 mg | ORAL_CAPSULE | Freq: Three times a day (TID) | ORAL | 0 refills | Status: AC | PRN

## 2024-04-11 NOTE — Progress Notes (Signed)
 We are sorry that you are not feeling well.  Here is how we plan to help!  Based on your presentation I believe you most likely have A cough due to bacteria.  When patients have a fever and a productive cough with a change in color or increased sputum production, we are concerned about bacterial bronchitis.  If left untreated it can progress to pneumonia.  If your symptoms do not improve with your treatment plan it is important that you contact your provider.   I have prescribed Azithromyin 250 mg: two tablets now and then one tablet daily for 4 additonal days    In addition you may use A prescription cough medication called Tessalon  Perles 100mg . You may take 1-2 capsules every 8 hours as needed for your cough.  I have prescribed Prednisone  20mg  Take 2 tablets (40mg ) daily for 5 days.  From your responses in the eVisit questionnaire you describe inflammation in the upper respiratory tract which is causing a significant cough.  This is commonly called Bronchitis and has four common causes:   Allergies Viral Infections Acid Reflux Bacterial Infection Allergies, viruses and acid reflux are treated by controlling symptoms or eliminating the cause. An example might be a cough caused by taking certain blood pressure medications. You stop the cough by changing the medication. Another example might be a cough caused by acid reflux. Controlling the reflux helps control the cough.  USE OF BRONCHODILATOR (RESCUE) INHALERS: There is a risk from using your bronchodilator too frequently.  The risk is that over-reliance on a medication which only relaxes the muscles surrounding the breathing tubes can reduce the effectiveness of medications prescribed to reduce swelling and congestion of the tubes themselves.  Although you feel brief relief from the bronchodilator inhaler, your asthma may actually be worsening with the tubes becoming more swollen and filled with mucus.  This can delay other crucial treatments,  such as oral steroid medications. If you need to use a bronchodilator inhaler daily, several times per day, you should discuss this with your provider.  There are probably better treatments that could be used to keep your asthma under control.     HOME CARE Only take medications as instructed by your medical team. Complete the entire course of an antibiotic. Drink plenty of fluids and get plenty of rest. Avoid close contacts especially the very young and the elderly Cover your mouth if you cough or cough into your sleeve. Always remember to wash your hands A steam or ultrasonic humidifier can help congestion.   GET HELP RIGHT AWAY IF: You develop worsening fever. You become short of breath You cough up blood. Your symptoms persist after you have completed your treatment plan MAKE SURE YOU  Understand these instructions. Will watch your condition. Will get help right away if you are not doing well or get worse.  Your e-visit answers were reviewed by a board certified advanced clinical practitioner to complete your personal care plan.  Depending on the condition, your plan could have included both over the counter or prescription medications. If there is a problem please reply  once you have received a response from your provider. Your safety is important to us .  If you have drug allergies check your prescription carefully.    You can use MyChart to ask questions about todays visit, request a non-urgent call back, or ask for a work or school excuse for 24 hours related to this e-Visit. If it has been greater than 24 hours you  will need to follow up with your provider, or enter a new e-Visit to address those concerns. You will get an e-mail in the next two days asking about your experience.  I hope that your e-visit has been valuable and will speed your recovery. Thank you for using e-visits.   I have spent 5 minutes in review of e-visit questionnaire, review and updating patient chart,  medical decision making and response to patient.   Delon CHRISTELLA Dickinson, PA-C

## 2024-05-03 ENCOUNTER — Other Ambulatory Visit: Payer: Self-pay | Admitting: Family Medicine

## 2024-05-03 DIAGNOSIS — J4540 Moderate persistent asthma, uncomplicated: Secondary | ICD-10-CM

## 2024-05-03 NOTE — Telephone Encounter (Signed)
 Requested Prescriptions  Pending Prescriptions Disp Refills   albuterol  (VENTOLIN  HFA) 108 (90 Base) MCG/ACT inhaler [Pharmacy Med Name: ALBUTEROL  HFA (PROAIR ) INHALER] 8.5 each 0    Sig: INHALE 2 PUFFS INTO THE LUNGS EVERY 4 (FOUR) HOURS AS NEEDED FOR WHEEZING OR SHORTNESS OF BREATH. OFFICE VISIT NEEDED FOR ADDITIONAL REFILLS     Pulmonology:  Beta Agonists 2 Passed - 05/03/2024  4:07 PM      Passed - Last BP in normal range    BP Readings from Last 1 Encounters:  12/08/23 130/80         Passed - Last Heart Rate in normal range    Pulse Readings from Last 1 Encounters:  12/08/23 96         Passed - Valid encounter within last 12 months    Recent Outpatient Visits           6 months ago Atypical chest pain   [facility] [place], [place], [place]

## 2024-05-09 ENCOUNTER — Other Ambulatory Visit: Payer: Self-pay | Admitting: Family Medicine

## 2024-05-09 DIAGNOSIS — F410 Panic disorder [episodic paroxysmal anxiety] without agoraphobia: Secondary | ICD-10-CM

## 2024-05-09 NOTE — Telephone Encounter (Signed)
 Please note previously pended order.    Copied from CRM #[id number]. Topic: Clinical - Medication Refill >> May 09, 2024  2:44 PM Sophia H wrote: Medication: LORazepam  (ATIVAN ) 0.5 MG tablet   Has the patient contacted their pharmacy? Yes, pharmacy advised they requested with no response.   This is the patient's preferred pharmacy:  [id number] - [place], [place] - [address] [place] [address] Phone: [phone] Fax: [phone]  Is this the correct pharmacy for this prescription? Yes If no, delete pharmacy and type the correct one.   Has the prescription been filled recently? Yes  Is the patient out of the medication? No, has 2 left.   Has the patient been seen for an appointment in the last year OR does the patient have an upcoming appointment? Yes, seen back in June.   Can we respond through MyChart? Yes but prefers phone call to wife if clinic is needing to reach out.   Agent: Please be advised that Rx refills may take up to 3 business days. We ask that you follow-up with your pharmacy.

## 2024-05-10 NOTE — Telephone Encounter (Signed)
 Requested medications are due for refill today.  yes  Requested medications are on the active medications list.  yes  Last refill. 02/05/2024 #30 2 rf  Future visit scheduled.   no  Notes to clinic.  Refill not delegated.    Requested Prescriptions  Pending Prescriptions Disp Refills   LORazepam  (ATIVAN ) 0.5 MG tablet [Pharmacy Med Name: LORAZEPAM  0.5 MG TABLET] 30 tablet 2    Sig: TAKE 1 TABLET BY MOUTH EVERY DAY AS NEEDED FOR ANXIETY     Not Delegated - Psychiatry: Anxiolytics/Hypnotics 2 Failed - 05/10/2024 11:12 AM      Failed - This refill cannot be delegated      Failed - Urine Drug Screen completed in last 360 days      Failed - Valid encounter within last 6 months    Recent Outpatient Visits           7 months ago Atypical chest pain   [place] [facility] [place], [place]              Passed - Patient is not pregnant

## 2024-05-10 NOTE — Telephone Encounter (Signed)
 Requested medication (s) are due for refill today: No  Requested medication (s) are on the active medication list: Yes  Last refill:  05/10/24  Future visit scheduled: No  Notes to clinic:  Not delegated.    Requested Prescriptions  Pending Prescriptions Disp Refills   LORazepam  (ATIVAN ) 0.5 MG tablet 30 tablet 2    Sig: TAKE 1 TABLET BY MOUTH EVERY DAY AS NEEDED FOR ANXIETY     Not Delegated - Psychiatry: Anxiolytics/Hypnotics 2 Failed - 05/10/2024 12:56 PM      Failed - This refill cannot be delegated      Failed - Urine Drug Screen completed in last 360 days      Failed - Valid encounter within last 6 months    Recent Outpatient Visits           7 months ago Atypical chest pain   [place] [facility] [place], [place] - Patient is not pregnant
